# Patient Record
Sex: Male | Born: 1945 | Race: White | Hispanic: No | Marital: Married | Smoking: Former smoker
Health system: Southern US, Community
[De-identification: ages and names within clinical notes are randomized; demographics above are authoritative.]

## PROBLEM LIST (undated history)

## (undated) DIAGNOSIS — E7800 Pure hypercholesterolemia, unspecified: Secondary | ICD-10-CM

## (undated) DIAGNOSIS — E039 Hypothyroidism, unspecified: Secondary | ICD-10-CM

## (undated) DIAGNOSIS — E119 Type 2 diabetes mellitus without complications: Secondary | ICD-10-CM

## (undated) DIAGNOSIS — I10 Essential (primary) hypertension: Secondary | ICD-10-CM

---

## 2022-03-25 ENCOUNTER — Emergency Department
Admission: EM | Admit: 2022-03-25 | Discharge: 2022-03-26 | Disposition: A | Discharge status: Other Acute Inpatient Hospital | Payer: Medicare Other | Attending: Emergency Medicine | Admitting: Emergency Medicine

## 2022-03-25 DIAGNOSIS — R456 Violent behavior: Secondary | ICD-10-CM | POA: Diagnosis present

## 2022-03-25 DIAGNOSIS — R7401 Elevation of levels of liver transaminase levels: Secondary | ICD-10-CM | POA: Diagnosis not present

## 2022-03-25 DIAGNOSIS — R4587 Impulsiveness: Secondary | ICD-10-CM | POA: Insufficient documentation

## 2022-03-25 DIAGNOSIS — R4689 Other symptoms and signs involving appearance and behavior: Secondary | ICD-10-CM | POA: Diagnosis present

## 2022-03-25 DIAGNOSIS — R7309 Other abnormal glucose: Secondary | ICD-10-CM | POA: Diagnosis not present

## 2022-03-25 DIAGNOSIS — F319 Bipolar disorder, unspecified: Secondary | ICD-10-CM | POA: Diagnosis not present

## 2022-03-25 DIAGNOSIS — Z20822 Contact with and (suspected) exposure to covid-19: Secondary | ICD-10-CM | POA: Diagnosis not present

## 2022-03-25 DIAGNOSIS — F29 Unspecified psychosis not due to a substance or known physiological condition: Secondary | ICD-10-CM | POA: Diagnosis not present

## 2022-03-25 DIAGNOSIS — D72829 Elevated white blood cell count, unspecified: Secondary | ICD-10-CM | POA: Diagnosis not present

## 2022-03-25 LAB — CBC
HCT: 45.3 % (ref 39.0–52.0)
Hemoglobin: 15.1 g/dL (ref 13.0–17.0)
MCH: 29.2 pg (ref 26.0–34.0)
MCHC: 33.3 g/dL (ref 30.0–36.0)
MCV: 87.6 fL (ref 80.0–100.0)
Platelets: 339 10*3/uL (ref 150–400)
RBC: 5.17 MIL/uL (ref 4.22–5.81)
RDW: 14.7 % (ref 11.5–15.5)
WBC: 12.6 10*3/uL — ABNORMAL HIGH (ref 4.0–10.5)
nRBC: 0 % (ref 0.0–0.2)

## 2022-03-25 LAB — COMPREHENSIVE METABOLIC PANEL
ALT: 39 U/L (ref 0–44)
AST: 44 U/L — ABNORMAL HIGH (ref 15–41)
Albumin: 4.2 g/dL (ref 3.5–5.0)
Alkaline Phosphatase: 80 U/L (ref 38–126)
Anion gap: 12 (ref 5–15)
BUN: 20 mg/dL (ref 8–23)
CO2: 21 mmol/L — ABNORMAL LOW (ref 22–32)
Calcium: 9.5 mg/dL (ref 8.9–10.3)
Chloride: 104 mmol/L (ref 98–111)
Creatinine, Ser: 1.14 mg/dL (ref 0.61–1.24)
GFR, Estimated: >60 mL/min (ref >60)
Glucose, Bld: 141 mg/dL — ABNORMAL HIGH (ref 70–99)
Potassium: 3.9 mmol/L (ref 3.5–5.1)
Sodium: 137 mmol/L (ref 135–145)
Total Bilirubin: 0.7 mg/dL (ref 0.3–1.2)
Total Protein: 8 g/dL (ref 6.5–8.1)

## 2022-03-25 LAB — SALICYLATE LEVEL: Salicylate Lvl: <7 mg/dL — ABNORMAL LOW (ref 7.0–30.0)

## 2022-03-25 LAB — RESP PANEL BY RT-PCR (FLU A&B, COVID) ARPGX2
Influenza A by PCR: NEGATIVE
Influenza B by PCR: NEGATIVE
SARS Coronavirus 2 by RT PCR: NEGATIVE

## 2022-03-25 LAB — ETHANOL: Alcohol, Ethyl (B): <10 mg/dL (ref <10)

## 2022-03-25 LAB — ACETAMINOPHEN LEVEL: Acetaminophen (Tylenol), Serum: <10 ug/mL — ABNORMAL LOW (ref 10–30)

## 2022-03-25 MED ORDER — ADULT MULTIVITAMIN W/MINERALS CH
1.0000 | ORAL_TABLET | Freq: Every day | ORAL | Status: DC
  Administered 2022-03-26: 1 via ORAL
  Filled 2022-03-25: qty 1

## 2022-03-25 MED ORDER — LORAZEPAM 1 MG PO TABS
1.0000 mg | ORAL_TABLET | Freq: Once | ORAL | Status: AC
  Administered 2022-03-26: 1 mg via ORAL
  Filled 2022-03-25 (×2): qty 1

## 2022-03-25 MED ORDER — LORAZEPAM 1 MG PO TABS: 1.0000 mg | ORAL_TABLET | ORAL | Status: DC | PRN

## 2022-03-25 MED ORDER — FOLIC ACID 1 MG PO TABS
1.0000 mg | ORAL_TABLET | Freq: Every day | ORAL | Status: DC
  Administered 2022-03-26: 1 mg via ORAL
  Filled 2022-03-25: qty 1

## 2022-03-25 MED ORDER — THIAMINE HCL 100 MG/ML IJ SOLN
100.0000 mg | Freq: Every day | INTRAMUSCULAR | Status: DC
  Filled 2022-03-25: qty 1

## 2022-03-25 MED ORDER — THIAMINE HCL 100 MG PO TABS
100.0000 mg | ORAL_TABLET | Freq: Every day | ORAL | Status: DC
  Administered 2022-03-26: 100 mg via ORAL
  Filled 2022-03-25: qty 1

## 2022-03-25 MED ORDER — ZIPRASIDONE MESYLATE 20 MG IM SOLR
20.0000 mg | Freq: Once | INTRAMUSCULAR | Status: AC
  Administered 2022-03-25: 20 mg via INTRAMUSCULAR
  Filled 2022-03-25: qty 20

## 2022-03-25 MED ORDER — LORAZEPAM 2 MG/ML IJ SOLN: 1.0000 mg | INTRAMUSCULAR | Status: DC | PRN

## 2022-03-25 NOTE — ED Triage Notes (Signed)
Pt presents via IVC with Sheriff. Pt has hx bipolar. IVC paperwork reports pt is aggressive, carrying a gun at all times outside of the hospital. Reports refusing to take medication.  ?

## 2022-03-25 NOTE — ED Notes (Signed)
Md aware that pt refused po ativan.  Per dr Tamala Julian, no other meds at this time.   ?

## 2022-03-25 NOTE — ED Notes (Signed)
Pt sleeping. 

## 2022-03-25 NOTE — ED Notes (Signed)
Pt brought back to room 20 with officer and rn.  Pt talking nonstop.  Pt refusing meds and cursing.  Pt reportedly not taking his meds at home, hx bipolar.  pt ?

## 2022-03-25 NOTE — BH Assessment (Signed)
Comprehensive Clinical Assessment (CCA) Note ? ?03/25/2022 ?Jorge Barrett ?[id number] ?Recommendations for Services/Supports/Treatments:  Consulted with Jorge Barrett., NP, who recommended pt. be observed overnight and reassessed in the AM. Notified Dr. Katrinka Barrett and Amy, RN of disposition recommendation.  ? ?Jorge Barrett is a 76 year old, English speaking, Caucasian male with a PMH of bipolar disorder. Per triage note:  Pt presents via IVC with Jorge Barrett. Pt has hx bipolar. IVC paperwork reports pt is aggressive, carrying a gun at all times outside of the hospital. Reports refusing to take medication. Of note pt was agitated and highly erratic while being escorted to his room. Pt was eating, in bed upon this writer's arrival. Pt presented with loud, pressured, and tangential speech. Pt was preoccupied with anger/resentment towards his wife for having him IVC'd, repeatedly calling her a dumb ass. Throughout the assessment pt. was impulsive and highly distractible. Pt.'s thoughts were grandiose and loose. Pt was scattered and had no insight into his behavior. Pt identified his main stressors as marital discord and family conflict with his daughter. Pt was manic with poor reality testing. Pt's mood was angry and his affect was tense. Pt was unable to answer assessment questions appropriately.  ?Chief Complaint:  ?Chief Complaint  ?Patient presents with  ? Psychiatric Evaluation  ? ?Visit Diagnosis: Bipolar disorder  ? ? ?CCA Screening, Triage and Referral (STR) ? ?Patient Reported Information ?How did you hear about [place]? Family/Friend ? ?Referral name: No data recorded ?Referral phone number: No data recorded ? ?Whom do you see for routine medical problems? No data recorded ?Practice/Facility Name: No data recorded ?Practice/Facility Phone Number: No data recorded ?Name of Contact: No data recorded ?Contact Number: No data recorded ?Contact Fax Number: No data recorded ?Prescriber Name: No data recorded ?Prescriber  Address (if known): No data recorded ? ?What Is the Reason for Your Visit/Call Today? Pt presents via IVC with Jorge Barrett. Pt has hx bipolar. IVC paperwork reports pt is aggressive, carrying a gun at all times outside of the hospital. Reports refusing to take medication. ? ?How Long Has This Been Causing You Problems? <Week ? ?What Do You Feel Would Help You the Most Today? Treatment for Depression or other mood problem ? ? ?Have You Recently Been in Any Inpatient Treatment (Hospital/Detox/Crisis Center/28-Day Program)? No data recorded ?Name/Location of Program/Hospital:No data recorded ?How Long Were You There? No data recorded ?When Were You Discharged? No data recorded ? ?Have You Ever Received Services From [employer] Before? No data recorded ?Who Do You See at [facility]? No data recorded ? ?Have You Recently Had Any Thoughts About Hurting Yourself? No ? ?Are You Planning to Commit Suicide/Harm Yourself At This time? No ? ? ?Have you Recently Had Thoughts About Hurting Someone Jorge Barrett? No ? ?Explanation: No data recorded ? ?Have You Used Any Alcohol or Drugs in the Past 24 Hours? No ? ?How Long Ago Did You Use Drugs or Alcohol? No data recorded ?What Did You Use and How Much? No data recorded ? ?Do You Currently Have a Therapist/Psychiatrist? No ? ?Name of Therapist/Psychiatrist: No data recorded ? ?Have You Been Recently Discharged From Any Office Practice or Programs? No ? ?Explanation of Discharge From Practice/Program: No data recorded ? ?  ?CCA Screening Triage Referral Assessment ?Type of Contact: Face-to-Face ? ?Is this Initial or Reassessment? No data recorded ?Date Telepsych consult ordered in [facility]:  No data recorded ?Time Telepsych consult ordered in [facility]:  No data recorded ? ?Patient Reported Information Reviewed? No data recorded ?  Patient Left Without Being Seen? No data recorded ?Reason for Not Completing Assessment: No data recorded ? ?Collateral Involvement: None provided ? ? ?Does Patient Have a  Automotive engineer Guardian? No data recorded ?Name and Contact of Legal Guardian: No data recorded ?If Minor and Not Living with Parent(s), Who has Custody? n/a ? ?Is CPS involved or ever been involved? Never ? ?Is APS involved or ever been involved? Never ? ? ?Patient Determined To Be At Risk for Harm To Self or Others Based on Review of Patient Reported Information or Presenting Complaint? Yes, for Harm to Others ? ?Method: No Plan ? ?Availability of Means: Has close by ? ?Intent: Intends to cause physical harm but not necessarily death ? ?Notification Required: No need or identified person ? ?Additional Information for Danger to Others Potential: Active psychosis ? ?Additional Comments for Danger to Others Potential: n/a ? ?Are There Guns or Other Weapons in Your Home? Yes ? ?Types of Guns/Weapons: Pt carries a gun with him at all times. ? ?Are These Weapons Safely Secured?                            -- (Unable to reach family at this time) ? ?Who Could Verify You Are Able To Have These Secured: n/a ? ?Do You Have any Outstanding Charges, Pending Court Dates, Parole/Probation? UTA ? ?Contacted To Inform of Risk of Harm To Self or Others: Unable to Contact: ? ? ?Location of Assessment: [facility] ED ? ? ?Does Patient Present under Involuntary Commitment? Yes ? ?IVC Papers Initial File Date: 03/25/22 ? ? ?[place] of Residence: [place] ? ? ?Patient Currently Receiving the Following Services: Medication Management ? ? ?Determination of Need: Emergent (2 hours) ? ? ?Options For Referral: Therapeutic Triage Services ? ? ? ? ?CCA Biopsychosocial ?Intake/Chief Complaint:  No data recorded ?Current Symptoms/Problems: No data recorded ? ?Patient Reported Schizophrenia/Schizoaffective Diagnosis in Past: No ? ? ?Strengths: Pt is able to assert needs ? ?Preferences: No data recorded ?Abilities: No data recorded ? ?Type of Services Patient Feels are Needed: No data recorded ? ?Initial Clinical Notes/Concerns: No data  recorded ? ?Mental Health Symptoms ?Depression:   ?None ?  ?Duration of Depressive symptoms: No data recorded  ?Mania:   ?Change in energy/activity; Increased Energy; Irritability; Racing thoughts; Overconfidence ?  ?Anxiety:    ?Tension; Worrying; Irritability; Restlessness; Difficulty concentrating ?  ?Psychosis:   ?Grossly disorganized speech ?  ?Duration of Psychotic symptoms:  ?Greater than six months ?  ?Trauma:   ?N/A ?  ?Obsessions:   ?Recurrent & persistent thoughts/impulses/images; Cause anxiety; Absent ?  ?Compulsions:   ?Repeated behaviors/mental acts; Intended to reduce stress or prevent another outcome; "Driven" to perform behaviors/acts; Absent insight/delusional ?  ?Inattention:   ?N/A ?  ?Hyperactivity/Impulsivity:   ?N/A ?  ?Oppositional/Defiant Behaviors:   ?Angry; Argumentative; Defies rules; Easily annoyed; Resentful; Temper ?  ?Emotional Irregularity:   ?Mood lability; Intense/inappropriate anger ?  ?Other Mood/Personality Symptoms:  No data recorded  ? ?Mental Status Exam ?Appearance and self-care  ?Stature:   ?Average ?  ?Weight:   ?Average weight ?  ?Clothing:   ?-- (In scrubs) ?  ?Grooming:   ?Normal ?  ?Cosmetic use:   ?None ?  ?Posture/gait:   ?Normal ?  ?Motor activity:   ?Restless ?  ?Sensorium  ?Attention:   ?Distractible ?  ?Concentration:   ?Focuses on irrelevancies; Scattered ?  ?Orientation:   ?Object; Person; Place; Situation ?  ?  Recall/memory:   ?Normal ?  ?Affect and Mood  ?Affect:   ?Labile ?  ?Mood:   ?Angry; Hypomania ?  ?Relating  ?Eye contact:   ?Normal ?  ?Facial expression:   ?Angry; Tense ?  ?Attitude toward examiner:   ?Irritable ?  ?Thought and Language  ?Speech flow:  ?Loud; Pressured ?  ?Thought content:   ?Delusions ?  ?Preoccupation:   ?Obsessions ?  ?Hallucinations:   ?None ?  ?Organization:  No data recorded  ?Executive Functions  ?Fund of Knowledge:   ?Fair ?  ?Intelligence:   ?Average ?  ?Abstraction:   ?Normal ?  ?Judgement:   ?Poor ?  ?Reality Testing:    ?Distorted ?  ?Insight:   ?None/zero insight ?  ?Decision Making:   ?Impulsive ?  ?Social Functioning  ?Social Maturity:   ?Impulsive ?  ?Social Judgement:   ?Heedless ?  ?Stress  ?Stressors:   ?Relationship; Family conf
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
## 2022-03-25 NOTE — ED Notes (Signed)
Meds given with officers in room.  Pt talking nonstop and uncooperative.    ?
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
## 2022-03-26 ENCOUNTER — Other Ambulatory Visit: Payer: Self-pay

## 2022-03-26 ENCOUNTER — Inpatient Hospital Stay
Admission: AD | Admit: 2022-03-26 | Discharge: 2022-04-04 | DRG: 885 | Disposition: A | Discharge status: Home / Self Care | Payer: Medicare Other | Source: Intra-hospital | Attending: Psychiatry | Admitting: Psychiatry

## 2022-03-26 ENCOUNTER — Encounter: Payer: Self-pay | Admitting: Psychiatry

## 2022-03-26 DIAGNOSIS — E7800 Pure hypercholesterolemia, unspecified: Secondary | ICD-10-CM | POA: Diagnosis present

## 2022-03-26 DIAGNOSIS — R456 Violent behavior: Secondary | ICD-10-CM | POA: Diagnosis not present

## 2022-03-26 DIAGNOSIS — Z79890 Hormone replacement therapy (postmenopausal): Secondary | ICD-10-CM

## 2022-03-26 DIAGNOSIS — Z87891 Personal history of nicotine dependence: Secondary | ICD-10-CM | POA: Diagnosis not present

## 2022-03-26 DIAGNOSIS — E119 Type 2 diabetes mellitus without complications: Secondary | ICD-10-CM | POA: Diagnosis present

## 2022-03-26 DIAGNOSIS — Z7984 Long term (current) use of oral hypoglycemic drugs: Secondary | ICD-10-CM | POA: Diagnosis not present

## 2022-03-26 DIAGNOSIS — F319 Bipolar disorder, unspecified: Principal | ICD-10-CM | POA: Diagnosis present

## 2022-03-26 DIAGNOSIS — F22 Delusional disorders: Secondary | ICD-10-CM | POA: Diagnosis present

## 2022-03-26 DIAGNOSIS — E039 Hypothyroidism, unspecified: Secondary | ICD-10-CM | POA: Diagnosis present

## 2022-03-26 DIAGNOSIS — Z79899 Other long term (current) drug therapy: Secondary | ICD-10-CM

## 2022-03-26 DIAGNOSIS — Z7985 Long-term (current) use of injectable non-insulin antidiabetic drugs: Secondary | ICD-10-CM

## 2022-03-26 DIAGNOSIS — K219 Gastro-esophageal reflux disease without esophagitis: Secondary | ICD-10-CM | POA: Diagnosis present

## 2022-03-26 DIAGNOSIS — Z888 Allergy status to other drugs, medicaments and biological substances status: Secondary | ICD-10-CM

## 2022-03-26 DIAGNOSIS — R4689 Other symptoms and signs involving appearance and behavior: Secondary | ICD-10-CM | POA: Diagnosis present

## 2022-03-26 DIAGNOSIS — I10 Essential (primary) hypertension: Secondary | ICD-10-CM | POA: Diagnosis present

## 2022-03-26 DIAGNOSIS — F29 Unspecified psychosis not due to a substance or known physiological condition: Secondary | ICD-10-CM | POA: Diagnosis present

## 2022-03-26 DIAGNOSIS — R4587 Impulsiveness: Secondary | ICD-10-CM | POA: Diagnosis present

## 2022-03-26 HISTORY — DX: Pure hypercholesterolemia, unspecified: E78.00

## 2022-03-26 HISTORY — DX: Hypothyroidism, unspecified: E03.9

## 2022-03-26 HISTORY — DX: Type 2 diabetes mellitus without complications: E11.9

## 2022-03-26 HISTORY — DX: Essential (primary) hypertension: I10

## 2022-03-26 LAB — URINE DRUG SCREEN, QUALITATIVE (ARMC ONLY)
Amphetamines, Ur Screen: NOT DETECTED
Barbiturates, Ur Screen: NOT DETECTED
Benzodiazepine, Ur Scrn: NOT DETECTED
Cannabinoid 50 Ng, Ur ~~LOC~~: NOT DETECTED
Cocaine Metabolite,Ur ~~LOC~~: NOT DETECTED
MDMA (Ecstasy)Ur Screen: NOT DETECTED
Methadone Scn, Ur: NOT DETECTED
Opiate, Ur Screen: NOT DETECTED
Phencyclidine (PCP) Ur S: NOT DETECTED
Tricyclic, Ur Screen: NOT DETECTED

## 2022-03-26 LAB — CBG MONITORING, ED: Glucose-Capillary: 125 mg/dL — ABNORMAL HIGH (ref 70–99)

## 2022-03-26 LAB — GLUCOSE, CAPILLARY: Glucose-Capillary: 103 mg/dL — ABNORMAL HIGH (ref 70–99)

## 2022-03-26 MED ORDER — THIAMINE HCL 100 MG/ML IJ SOLN: 100.0000 mg | Freq: Every day | INTRAMUSCULAR | Status: DC

## 2022-03-26 MED ORDER — MAGNESIUM HYDROXIDE 400 MG/5ML PO SUSP: 30.0000 mL | Freq: Every day | ORAL | Status: DC | PRN

## 2022-03-26 MED ORDER — OLANZAPINE 5 MG PO TABS: 5.0000 mg | ORAL_TABLET | Freq: Every day | ORAL | Status: DC

## 2022-03-26 MED ORDER — ACETAMINOPHEN 325 MG PO TABS
650.0000 mg | ORAL_TABLET | Freq: Four times a day (QID) | ORAL | Status: DC | PRN
Start: 2022-03-26 — End: 2022-03-29
  Administered 2022-03-27 (×2): 650 mg via ORAL
  Filled 2022-03-26 (×2): qty 2

## 2022-03-26 MED ORDER — LORAZEPAM 2 MG/ML IJ SOLN: 1.0000 mg | INTRAMUSCULAR | Status: DC | PRN

## 2022-03-26 MED ORDER — LORAZEPAM 1 MG PO TABS
1.0000 mg | ORAL_TABLET | Freq: Four times a day (QID) | ORAL | Status: DC | PRN
  Administered 2022-03-26: 1 mg via ORAL
  Filled 2022-03-26: qty 1

## 2022-03-26 MED ORDER — OLANZAPINE 5 MG PO TBDP
5.0000 mg | ORAL_TABLET | Freq: Once | ORAL | Status: AC
  Administered 2022-03-26: 5 mg via ORAL
  Filled 2022-03-26: qty 1

## 2022-03-26 MED ORDER — FOLIC ACID 1 MG PO TABS
1.0000 mg | ORAL_TABLET | Freq: Every day | ORAL | Status: DC
  Administered 2022-03-27 – 2022-04-04 (×9): 1 mg via ORAL
  Filled 2022-03-26 (×9): qty 1

## 2022-03-26 MED ORDER — ADULT MULTIVITAMIN W/MINERALS CH
1.0000 | ORAL_TABLET | Freq: Every day | ORAL | Status: DC
  Administered 2022-03-27 – 2022-04-04 (×9): 1 via ORAL
  Filled 2022-03-26 (×9): qty 1

## 2022-03-26 MED ORDER — LORAZEPAM 2 MG PO TABS
2.0000 mg | ORAL_TABLET | Freq: Once | ORAL | Status: AC
  Administered 2022-03-26: 2 mg via ORAL
  Filled 2022-03-26: qty 1

## 2022-03-26 MED ORDER — LORAZEPAM 1 MG PO TABS: 1.0000 mg | ORAL_TABLET | Freq: Four times a day (QID) | ORAL | Status: DC | PRN

## 2022-03-26 MED ORDER — LORAZEPAM 1 MG PO TABS: 1.0000 mg | ORAL_TABLET | ORAL | Status: DC | PRN

## 2022-03-26 MED ORDER — OLANZAPINE 5 MG PO TABS
5.0000 mg | ORAL_TABLET | Freq: Every day | ORAL | Status: DC
  Administered 2022-03-26: 5 mg via ORAL
  Filled 2022-03-26: qty 1

## 2022-03-26 MED ORDER — ALUM & MAG HYDROXIDE-SIMETH 200-200-20 MG/5ML PO SUSP: 30.0000 mL | ORAL | Status: DC | PRN

## 2022-03-26 MED ORDER — THIAMINE HCL 100 MG PO TABS
100.0000 mg | ORAL_TABLET | Freq: Every day | ORAL | Status: DC
  Administered 2022-03-27 – 2022-04-04 (×9): 100 mg via ORAL
  Filled 2022-03-26 (×9): qty 1

## 2022-03-26 NOTE — Progress Notes (Signed)
Patient woke up and is still very tired. Patient was incontinent of urine. Patient was changed and brought to the bathroom. Patient is able to walk from room to dayroom with front wheel walker with contact guard assist. Patient appears very unsteady. Patient yelled out "water" multiple times. Patient was given water and dinner tray. Patient is currently in the dayroom eating dinner. ?

## 2022-03-26 NOTE — ED Notes (Addendum)
Offered ativan due to CIWA and agitation, declines. Pt provided water. Pt with rapid and loud speech, disorganized thoughts, grandiose delusions. ?

## 2022-03-26 NOTE — ED Notes (Signed)
Spoke to patient's wife and daughter with verbal permission from pt.  Daughter stated that she and her brother noticed a difference with pt's behavior "At the end of January/beginning of February he started calling more and talking faster."  Family reported that pt's behavior has worsened up until this point.  Daughter stated that he was inpatient once prior in 2018 for this same type of behavior(s).  ?

## 2022-03-26 NOTE — ED Provider Notes (Signed)
Emergency Medicine Observation Re-evaluation Note ? ?Jorge Barrett is a 76 y.o. male, seen on rounds today.  Pt initially presented to the ED for complaints of Psychiatric Evaluation ?Currently, the patient is resting, voices no medical complaints. ? ?Physical Exam  ?BP (!) 157/91   Pulse (!) 101   Temp 98.6 ?F (37 ?C) (Oral)   Resp 16   SpO2 98%  ?Physical Exam ?General: Resting in no acute distress ?Cardiac: No cyanosis ?Lungs: Equal rise and fall ?Psych: Not agitated ? ?ED Course / MDM  ?EKG:  ? ?I have reviewed the labs performed to date as well as medications administered while in observation.  Recent changes in the last 24 hours include no events overnight. ? ?Plan  ?Current plan is for psychiatric disposition. ?Jorge Barrett is under involuntary commitment. ?  ? ?  ?Irean Hong, MD ?03/26/22 0530 ? ?

## 2022-03-26 NOTE — Plan of Care (Signed)

## 2022-03-26 NOTE — ED Notes (Signed)
Hospital meal provided, pt tolerated w/o complaints.  Waste discarded appropriately.  

## 2022-03-26 NOTE — ED Notes (Signed)
Pt woken up from nap and offered hospital lunch meal.  Pt declined and requested to return to sleep.  Staff informed pt that he would be transferring to an inpatient unit today, pt is agreeable. ?

## 2022-03-26 NOTE — ED Notes (Signed)
Report to [place], RN in [place]. Pt taken to unit by RN. ?

## 2022-03-26 NOTE — Progress Notes (Addendum)
?  Chaplain On-Call responded to Spiritual Care Consult Order from Dr. Chiquita Loth, which read "psych patient requesting Chaplain for emotional support". ? ?Chaplain knocked on the Unit door, and the Nurse who answered stated that the patient is sleeping soundly after being unable to do so for a lengthy time. ? ?Chaplain will refer to the next On-Call Chaplain for follow-up and visit attempt. ? ?Chaplain Evelena Peat ?M.Div., [facility] ?

## 2022-03-26 NOTE — ED Notes (Signed)
Pt is awake, watching tv with no acute distress. ?

## 2022-03-26 NOTE — ED Notes (Signed)
Pt a/o x3 ambulatory and continent, sent to inpatient geropsych @ [facility] report called to Deirdre Priest, RN.  All belongings accounted for and sent with security to [place] unit ?

## 2022-03-26 NOTE — TOC Initial Note (Signed)
Transition of Care (TOC) - Initial/Assessment Note  ? ? ?Patient Details  ?Name: Jorge Barrett [facility] ?MRN: [id number] ?Date of Birth: Oct 09, 1946 ? ?Transition of Care (TOC) CM/SW Contact:    ?Allayne Butcher, RN ?Phone Number: ?03/26/2022, 1:49 PM ? ?Clinical Narrative:                 ?Patient is being admitted to [facility].  TOC signing off.  ? ?  ?  ? ? ?Patient Goals and CMS Choice ?  ?  ?  ? ?Expected Discharge Plan and Services ?  ?  ?  ?  ?  ?                ?  ?  ?  ?  ?  ?  ?  ?  ?  ?  ? ?Prior Living Arrangements/Services ?  ?  ?  ?       ?  ?  ?  ?  ? ?Activities of Daily Living ?  ?  ? ?Permission Sought/Granted ?  ?  ?   ?   ?   ?   ? ?Emotional Assessment ?  ?  ?  ?  ?  ?  ? ?Admission diagnosis:  IVC ?Patient Active Problem List  ? Diagnosis Date Noted  ? Bipolar 1 disorder (HCC) 03/26/2022  ? Aggressive behavior 03/26/2022  ? Psychosis (HCC) 03/26/2022  ? ?PCP:  Pcp, No ?Pharmacy:   ?[facility] - [place], [place] - [address] ?[address] ?[place] [address] ?Phone: [phone] Fax: [phone] ? ? ? ? ?Social Determinants of Health (SDOH) Interventions ?  ? ?Readmission Risk Interventions ?   ? View : No data to display.  ?  ?  ?  ? ? ? ?

## 2022-03-26 NOTE — BH Assessment (Signed)
Patient is to be admitted to [facility] Unit today 03/26/22 at 2:00pm by Dr.  Marlou Porch .  ?Attending Physician will be Dr.  Marlou Porch .   ?Patient has been assigned to room L-34, by [facility] Charge Nurse Marchelle Folks.   ?  ?ER staff is aware of the admission: ?Drinda Butts, ER Secretary   ?Dr. Darnelle Catalan, ER MD  ?Florentina Addison, Patient's Nurse  ?Sue Lush, Patient Access.  ?
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
## 2022-03-26 NOTE — Progress Notes (Signed)
Patient came onto unit asleep and snoring. Patient required multiple sternal rub to arouse patient. Unit manager notified and came to unit to assess patient. Patient vitals and CBG were taken. BP was 144/78, and CBG was 103. Other vital signs WNL.  Patient was able to tell staff his name and birthday before falling back asleep. Patient sedated and unable to answer admission questions. Patient's pants were wet and patient was changed with assistance of staff members. Skin assessment done with Alphonzo Lemmings, RN. No contraband found. Patient's head and face was flushed. Both legs were swollen and discolored. Patient was able to stand and transfer to bed with assistance of staff members.  ?

## 2022-03-26 NOTE — ED Notes (Signed)
Pt. Transferred to [place] , room# 5 from main ED .Patient was screened by security before entering the unit. Report to include Situation, Background, Assessment and Recommendations from Ara Kussmaul, RN . Pt. Oriented to unit including Q15 minute rounds as well as the security cameras for their protection. Patient is alert and oriented, warm and dry in no acute distress. Unit rules and regulations have been explained to patient.  Pt is hyper-verbal and seems to be fixated on spousal infidelity.  Staff explained to pt that the [facility] would be in later to reassess for inpatient needs ?

## 2022-03-26 NOTE — ED Notes (Signed)
BS 125
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
## 2022-03-26 NOTE — ED Notes (Addendum)
Pt standing at entrance of his room door trying to talk to the patients in the hallway. Explained that this is not appropriate and that he must stay in his room at this time. Pt requesting door to stay open. Informed pt that if he would like the door to stay open, he must stay in his room and stop talking to the adolescent patients in the hallway. Pt verbalizes understanding. ?

## 2022-03-27 ENCOUNTER — Encounter: Payer: Self-pay | Admitting: Psychiatry

## 2022-03-27 ENCOUNTER — Inpatient Hospital Stay: Payer: Medicare Other

## 2022-03-27 DIAGNOSIS — F319 Bipolar disorder, unspecified: Secondary | ICD-10-CM | POA: Diagnosis not present

## 2022-03-27 MED ORDER — QUETIAPINE FUMARATE 100 MG PO TABS
100.0000 mg | ORAL_TABLET | Freq: Every day | ORAL | Status: DC
  Administered 2022-03-27 – 2022-04-03 (×8): 100 mg via ORAL
  Filled 2022-03-27 (×8): qty 1

## 2022-03-27 MED ORDER — TEMAZEPAM 15 MG PO CAPS
30.0000 mg | ORAL_CAPSULE | Freq: Every evening | ORAL | Status: DC | PRN
  Administered 2022-03-28 – 2022-04-01 (×5): 30 mg via ORAL
  Filled 2022-03-27 (×5): qty 2

## 2022-03-27 MED ORDER — GLUCERNA SHAKE PO LIQD
237.0000 mL | Freq: Three times a day (TID) | ORAL | Status: DC
Start: 2022-03-27 — End: 2022-04-04
  Administered 2022-03-27 – 2022-04-04 (×26): 237 mL via ORAL

## 2022-03-27 NOTE — BHH Counselor (Signed)
CSW attempted to meet with pt regarding completion of the PSA. However, due to pt's current state assessment will need to be completed at a later date. Pt currently agitated (hyperverbal and argumentative), fussing at nursing staff, with focus directed towards discharge and wanting to speak to doctor about leaving. CSW will attempt assessment at a later date. ? ?Jorge Barrett. Jorge Barrett, MSW, LCSW, LCAS ?03/27/2022 3:48 PM ? ?

## 2022-03-27 NOTE — Progress Notes (Signed)
At approximately 0126, MHT heard pt's walker fall and pt say "Oh shit". Pt was found near room door by (2-RNs and MHT) lying on his R side on floor covered in urine with large amount of urine on floor with his walker overturned nearby. Pt states that he fell while ambulating with walker to bathroom. He c/o R hip pain. He denies headache. No obvious injuries noted upon inspection of head and body. Pt assisted to wheelchair with assistance of 4 staff members, cleaned and placed back into bed with assistance of 3 staff members due to his overall weakness. On-call provider notified and order for R hip x-ray obtained. ?

## 2022-03-27 NOTE — Progress Notes (Signed)
NUTRITION ASSESSMENT ? ?Pt identified as at risk on the Malnutrition Screen Tool ? ?INTERVENTION: ? ?-Continue MVI with minerals daily ?-Change diet to carb modified ?-Double protein portions with meals ?-Glucerna Shake po TID, each supplement provides 220 kcal and 10 grams of protein  ? ?NUTRITION DIAGNOSIS: ?Unintentional weight loss related to sub-optimal intake as evidenced by pt report.  ? ?Goal: ?Pt to meet >/= 90% of their estimated nutrition needs. ? ?Monitor:  ?PO intake ? ?Assessment:  ?Pt with PMH of HTN, DM, hypercholesterolemia, and hypothyroidism. ? ?Pt admitted with bipolar disorder and is he currently IVC'd.  ? ?Noted pt fell this morning per nursing notes.  ? ?Per RN notes, pt reports good appetite and has been eating most meals. He did refuse one meal tray.  ? ?Per CareEverywhere records, wt was 262# on 01/17/22. Pt has experienced a 10.2% wt loss over the past 2 months, which is significant for time frame.  ? ?Medications reviewed and include folic acid and thiamine.  ? ?Per CarEverywhere, last documented Hgb A1c: 7.1. PTA DM medications are 2 mg semaglutide weekly.   ? ?Labs reviewed: CBGS: 103-125 (inpatient orders for glycemic control are none).   ? ?Height: ?Ht Readings from Last 1 Encounters:  ?03/26/22 6' (1.829 m)  ? ? ?Weight: ?Wt Readings from Last 1 Encounters:  ?03/26/22 107 kg  ? ? ?Weight Hx: ?Wt Readings from Last 10 Encounters:  ?03/26/22 107 kg  ? ? 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 ? ?Estimated Nutritional Needs: ?Kcal: 25-30 kcal/kg ?Protein: > 1 gram protein/kg ?Fluid: 1 ml/kcal ? ?Diet Order:  ?Diet Order   ? ?       ?  Diet regular Room service appropriate? Yes; Fluid consistency: Thin  Diet effective now       ?  ? ?  ?  ? ?  ? ?Pt is also offered choice of unit snacks mid-morning and mid-afternoon.  ?Pt is eating as desired.  ? ?Lab results and medications reviewed.  ? ?Levada Schilling, RD, LDN, CDCES ?Registered  Dietitian II ?Certified Diabetes Care and Education Specialist ?Please refer to [facility] for RD and/or RD on-call/weekend/after hours pager   ?

## 2022-03-27 NOTE — Progress Notes (Signed)
Pt sitting on the bed naked covered by a towel. Pt walker is in the bathroom. "I'm walking alright without it." Pt educated on need to use his walker for ambulation given his fall last night. Pt verbalized understanding. Non-slip socks placed on patient's feet. Pt has a room close to the nurse's station.  ?

## 2022-03-27 NOTE — [facility] Suicide Risk Assessment (Signed)
[facility] Admission Suicide Risk Assessment ? ? ?Nursing information obtained from:    ?Demographic factors:    ?Current Mental Status:    ?Loss Factors:    ?Historical Factors:    ?Risk Reduction Factors:    ? ?Total Time spent with patient: 1 hour ?Principal Problem: Bipolar 1 disorder ([place]) ?Diagnosis:  Principal Problem: ?  Bipolar 1 disorder ([place]) ? ?Subjective Data: Jorge Barrett is a 76 y.o. male patient presented to [facility] ED via law enforcement under involuntary commitment status (IVC). Per the ED triage nurse note, Pt presents via IVC with [place]. Pt has hx bipolar. IVC paperwork reports pt is aggressive, carrying a gun at all times outside the hospital. It was reported that he was refusing to take medication.  ?The patient has a hx of bipolar disorder. The patient was aggressive; he was always carrying a gun outside of the hospital. Reports were refusing to take medication. Of note, pt was agitated and highly erratic during his initial assessment. The patient refuses to take any medication when the nurse offers him. The patient is threatening his wife and using disparaging language toward his wife. The patient is preoccupied with anger/resentment towards his wife for having him IVC'd, repeatedly calling her a dumb ass. Throughout the assessment, pt. was impulsive and highly distractible.  ? ?This provider saw The patient face-to-face; the chart was reviewed, and consulted with Dr. Tamala Julian on 03/25/2022 due to the patient's care. It was discussed with the EDP that the patient remained under observation overnight and will be reassessed in the a.m. to determine if he meets the criteria for psychiatric inpatient admission; he could be discharged home. ? ?On evaluation, the patient is alert and oriented x4, angry, uncooperative, and mood-congruent with affect. The patient does not appear to be responding to internal or external stimuli. The patient is presenting with delusional thinking. The patient denies  auditory or visual hallucinations. The patient is presenting with psychotic and paranoid behaviors. During an encounter with the patient, he cannot answer most questions. ? ?Continued Clinical Symptoms:  ?Alcohol Use Disorder Identification Test Final Score (AUDIT): 3 ?The "Alcohol Use Disorders Identification Test", Guidelines for Use in Primary Care, Second Edition.  World Pharmacologist [facility]). ?Score between 0-7:  no or low risk or alcohol related problems. ?Score between 8-15:  moderate risk of alcohol related problems. ?Score between 16-19:  high risk of alcohol related problems. ?Score 20 or above:  warrants further diagnostic evaluation for alcohol dependence and treatment. ? ? ?CLINICAL FACTORS: Manic ?  ? ? ?Musculoskeletal: ?Strength & Muscle Tone: within normal limits ?Gait & Station: normal ?Patient leans: N/A ? ?Psychiatric Specialty Exam: ? ?Presentation  ?General Appearance: Bizarre ? ?Eye Contact:Good ? ?Speech:Pressured ? ?Speech Volume:Increased ? ?Handedness:Right ? ? ?Mood and Affect  ?Mood:Irritable ? ?Affect:Inappropriate; Full Range ? ? ?Thought Process  ?Thought Processes:Disorganized ? ?Descriptions of Associations:Tangential ? ?Orientation:Full (Time, Place and Person) ? ?Thought Content:Illogical; Tangential ? ?History of Schizophrenia/Schizoaffective disorder:No ? ?Duration of Psychotic Symptoms:Greater than six months ? ?Hallucinations:No data recorded ?Ideas of Reference:None ? ?Suicidal Thoughts:No data recorded ?Homicidal Thoughts:No data recorded ? ?Sensorium  ?Memory:Immediate Poor; Recent Fair; Remote Fair ? ?Judgment:Impaired ? ?Insight:Poor ? ? ?Executive Functions  ?Concentration:Poor ? ?Attention Span:Poor ? ?Recall:Poor ? ?Fund of Knowledge:Poor ? ?Language:Poor ? ? ?Psychomotor Activity  ?Psychomotor Activity:No data recorded ? ?Assets  ?Assets:Desire for Improvement; Resilience; Social Support ? ? ?Sleep  ?Sleep:No data recorded ? ? ? ?Blood pressure 133/69, pulse 92,  temperature (!) 97.3 ?F (  36.3 ?C), temperature source Oral, resp. rate 16, height 6' (1.829 m), weight 107 kg, SpO2 97 %. Body mass index is 32.01 kg/m?. ? ? ?COGNITIVE FEATURES THAT CONTRIBUTE TO RISK:  ?Closed-mindedness   ? ?SUICIDE RISK:  ? Minimal: No identifiable suicidal ideation.  Patients presenting with no risk factors but with morbid ruminations; may be classified as minimal risk based on the severity of the depressive symptoms ? ?PLAN OF CARE: See orders ? ?I certify that inpatient services furnished can reasonably be expected to improve the patient's condition.  ? ?Parks Ranger, DO ?03/27/2022, 11:39 AM ? ?

## 2022-03-27 NOTE — Evaluation (Signed)
Physical Therapy Evaluation ?Patient Details ?Name: Jorge Barrett [facility] ?MRN: [id number] ?DOB: 18-Oct-1946 ?Today's Date: 03/27/2022 ? ?History of Present Illness ? Pt admitted under IVC with bipolar 1 and complaints of fall this AM while ambulating to bathroom. History includes bipolar disorder.  ?Clinical Impression ? Pt is a pleasant 76 year old male who was admitted for bipolar. Pt performs transfers with mod I and ambulation with supervision and RW. Pt demonstrates deficits with R hip pain from recent fall, imaging negative. Pt demonstrates all bed mobility/transfers/ambulation at baseline level. Educated in falls prevention and recommended to continue use of RW. Pt does not require any further PT needs at this time. Pt will be dc in house and does not require follow up. RN aware. Will dc current orders. ? ?   ? ?Recommendations for follow up therapy are one component of a multi-disciplinary discharge planning process, led by the attending physician.  Recommendations may be updated based on patient status, additional functional criteria and insurance authorization. ? ?Follow Up Recommendations No PT follow up ? ?  ?Assistance Recommended at Discharge PRN  ?Patient can return home with the following ?   ? ?  ?Equipment Recommendations None recommended by PT  ?Recommendations for Other Services ?    ?  ?Functional Status Assessment Patient has not had a recent decline in their functional status  ? ?  ?Precautions / Restrictions Precautions ?Precautions: Fall ?Restrictions ?Weight Bearing Restrictions: No  ? ?  ? ?Mobility ? Bed Mobility ?  ?  ?  ?  ?  ?  ?  ?General bed mobility comments: not performed, received in chair ?  ? ?Transfers ?Overall transfer level: Modified independent ?Equipment used: Rolling walker (2 wheels) ?Transfers: Sit to/from Stand ?Sit to Stand: Modified independent (Device/Increase time) ?  ?  ?  ?  ?  ?General transfer comment: takes increased time, however pt able to perform transfers  safely using RW. Once standing, upright posture noted ?  ? ?Ambulation/Gait ?Ambulation/Gait assistance: Supervision ?Gait Distance (Feet): 150 Feet ?Assistive device: Rolling walker (2 wheels) ?Gait Pattern/deviations: Step-through pattern ?  ?  ?  ?General Gait Details: ambulated in hallway with safe technique. RW used for safety with pt unable to ambulate and carry conversation, stopping to talk when therapist asked question. Pt able to complete turns without LOB. Cues for keeping RW close to body and falls risk awareness. ? ?Stairs ?  ?  ?  ?  ?  ? ?Wheelchair Mobility ?  ? ?Modified Rankin (Stroke Patients Only) ?  ? ?  ? ?Balance Overall balance assessment: Needs assistance, History of Falls ?Sitting-balance support: Feet supported, No upper extremity supported ?Sitting balance-Leahy Scale: Good ?  ?  ?Standing balance support: Bilateral upper extremity supported ?Standing balance-Leahy Scale: Fair ?  ?  ?  ?  ?  ?  ?  ?  ?  ?  ?  ?  ?   ? ? ? ?Pertinent Vitals/Pain Pain Assessment ?Pain Assessment: Faces ?Faces Pain Scale: Hurts a little bit ?Pain Location: R hip ?Pain Descriptors / Indicators: Discomfort ?Pain Intervention(s): Limited activity within patient's tolerance, Repositioned  ? ? ?Home Living Family/patient expects to be discharged to:: Private residence ?Living Arrangements: Spouse/significant other ?Available Help at Discharge: Family ?  ?  ?  ?  ?  ?  ?Home Equipment: Agricultural consultant (2 wheels) ?Additional Comments: pt is very verbose and difficult to obtain history. Pt reports he lives in [place], unable to obtain accurate home set  up  ?  ?Prior Function Prior Level of Function : Independent/Modified Independent;Driving ?  ?  ?  ?  ?  ?  ?Mobility Comments: reports no recent falls. Previously indep. Has been using RW during this admission ?  ?  ? ? ?Hand Dominance  ?   ? ?  ?Extremity/Trunk Assessment  ? Upper Extremity Assessment ?Upper Extremity Assessment: Overall WFL for tasks  assessed ?  ? ?Lower Extremity Assessment ?Lower Extremity Assessment: Overall WFL for tasks assessed ?  ? ?   ?Communication  ? Communication: No difficulties (very verbose)  ?Cognition Arousal/Alertness: Awake/alert ?Behavior During Therapy: Restless ?Overall Cognitive Status: Within Functional Limits for tasks assessed ?  ?  ?  ?  ?  ?  ?  ?  ?  ?  ?  ?  ?  ?  ?  ?  ?General Comments: very talkative and intermittently agitated ?  ?  ? ?  ?General Comments   ? ?  ?Exercises    ? ?Assessment/Plan  ?  ?PT Assessment Patient does not need any further PT services  ?PT Problem List   ? ?   ?  ?PT Treatment Interventions     ? ?PT Goals (Current goals can be found in the Care Plan section)  ?Acute Rehab PT Goals ?Patient Stated Goal: to go home ?PT Goal Formulation: All assessment and education complete, DC therapy ?Time For Goal Achievement: 03/27/22 ?Potential to Achieve Goals: Good ? ?  ?Frequency   ?  ? ? ?Co-evaluation   ?  ?  ?  ?  ? ? ?  ?AM-PAC PT "6 Clicks" Mobility  ?Outcome Measure Help needed turning from your back to your side while in a flat bed without using bedrails?: None ?Help needed moving from lying on your back to sitting on the side of a flat bed without using bedrails?: None ?Help needed moving to and from a bed to a chair (including a wheelchair)?: None ?Help needed standing up from a chair using your arms (e.g., wheelchair or bedside chair)?: None ?Help needed to walk in hospital room?: None ?Help needed climbing 3-5 steps with a railing? : None ?6 Click Score: 24 ? ?  ?End of Session   ?Activity Tolerance: Patient tolerated treatment well ?Patient left: in chair ?Nurse Communication: Mobility status ?PT Visit Diagnosis: Difficulty in walking, not elsewhere classified (R26.2) ?  ? ?Time: 1610-9604 ?PT Time Calculation (min) (ACUTE ONLY): 22 min ? ? ?Charges:   PT Evaluation ?$PT Eval Low Complexity: 1 Low ?PT Treatments ?$Gait Training: 8-22 mins ?  ?   ? ? ?Elizabeth Palau, PT, DPT,  GCS ?[phone] ? ? ?Jorge Barrett ?03/27/2022, 4:31 PM ? ?

## 2022-03-27 NOTE — Plan of Care (Signed)
  Problem: Clinical Measurements: Goal: Will remain free from infection Outcome: Progressing   Problem: Coping: Goal: Level of anxiety will decrease Outcome: Progressing   Problem: Safety: Goal: Ability to remain free from injury will improve Outcome: Progressing   

## 2022-03-27 NOTE — Progress Notes (Addendum)
Patient appeared very drowsy this morning and was assisted to bathroom and to dayroom for breakfast. Patient with unsteady gait while ambulating with walker. Patient ate all meals and compliant with medication. Pressured speech, grandeur delusions, and cooperative but can be demanding at times and appears focused on discharge due to having to "get back to my business." Patient states he is losing money while he is here. Patient more alert with more steady gait this evening. Patient's x-ray completed as well as PT consult. Patient denies SI,HI, and A/V/H with no plan/intent. Does not understand why he is here and continues to blame his wife. Patient assisted with contacting daughter and son. Patient stated having pain 5 out of 10 on R hip from last night's fall. Tylenol po prn given which provided some temporary relief. No falls/injuries and no s/s of current distress.  ?

## 2022-03-27 NOTE — Progress Notes (Addendum)
   03/27/22 1930  Psych Admission Type (Psych Patients Only)  Admission Status Involuntary  Psychosocial Assessment  Patient Complaints Anger;Other (Comment) (pain in his right hip)  Eye Contact Avoids  Facial Expression Animated  Affect Labile  Speech Pressured;Rapid;Tangential  Interaction Demanding;Dominating;Assertive  Motor Activity Slow  Appearance/Hygiene In scrubs  Behavior Characteristics Cooperative;Irritable  Mood Labile  Thought Process  Coherency Circumstantial  Content Blaming others;Preoccupation;Paranoia  Delusions Grandeur  Perception WDL  Hallucination None reported or observed  Judgment Limited  Confusion None  Danger to Self  Current suicidal ideation? Denies  Danger to Others  Danger to Others None reported or observed   Pt seen in his room. Pt has rapid pressured speech. Pt is upset that his wife had him IVC'd. Pt has been saying racially inappropriate things to staff. Pt has urinated on himself. Pt given towels and hygiene supplies to clean himself up. "I don't have bipolar. What's that? And they gonna pay for this crack in my hip. They brought me a urinal today but that don't do me no good. It's costing $1000 a day to my wife for me being here. 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 When I get home I'm gonna take that dog of hers to my brothers and tell her it got away." Pt is redirectable but it takes some effort. Pt denies SI, HI, AVH. Rates pain 9.5/10. Xray showed no fracture to the right hip. Visual inspection shows no bruising and skin is intact. Pt denies anxiety and depression. Pt given scheduled medications as prescribed. Q15 minute checks for safety. Pt safe on unit.

## 2022-03-27 NOTE — H&P (Signed)
Psychiatric Admission Assessment Adult ? ?Barrett Identification: Jorge Barrett ?MRN:  [id number] ?Date of Evaluation:  03/27/2022 ?Chief Complaint:  Bipolar 1 disorder ([place]) [F31.9] ?Principal Diagnosis: Bipolar 1 disorder ([place]) ?Diagnosis:  Principal Problem: ?  Bipolar 1 disorder ([place]) ? ?History of Present Illness: Jorge Barrett is a 76 year old white male who presents on an involuntary basis to Jorge geriatric psychiatry unit for pressured speech, aggressive behavior, not sleeping, and disorganized behavior.  Apparently, he was started on Ozempic about 2 months ago and has lost 20 pounds and family states this is when his behavior started because he does not have any psychiatric history although there is a statement of a history of bipolar disorder which he denies.  He has never seen a psychiatrist and has never been psychiatrically hospitalized.  He denies depression, auditory hallucinations, suicidal ideation or past suicide attempts.  He states that he feels great since he lost his 20 pounds.  He does present with pressured speech and his thought process is tangential and circumstantial.  He is pleasant and cooperative and did take his medications. ? ?PER INITIAL INTAKE: ?Jorge Barrett is a 76 y.o. male Barrett presented to [facility] ED via law enforcement under involuntary commitment status (IVC). Per Jorge ED triage nurse note, Pt presents via IVC with [place]. Pt has hx bipolar. IVC paperwork reports pt is aggressive, carrying a gun at all times outside Jorge hospital. It was reported that he was refusing to take medication.  ?Jorge Barrett has a hx of bipolar disorder. Jorge Barrett was aggressive; he was always carrying a gun outside of Jorge hospital. Reports were refusing to take medication. Of note, pt was agitated and highly erratic during his initial assessment. Jorge Barrett refuses to take any medication when Jorge nurse offers him. Jorge Barrett is threatening his wife and using disparaging language  toward his wife. Jorge Barrett is preoccupied with anger/resentment towards his wife for having him IVC'd, repeatedly calling her a dumb ass. Throughout Jorge assessment, pt. was impulsive and highly distractible.  ? ?This provider saw Jorge Barrett face-to-face; Jorge chart was reviewed, and consulted with Dr. Tamala Julian on 03/25/2022 due to Jorge Barrett's care. It was discussed with Jorge EDP that Jorge Barrett remained under observation overnight and will be reassessed in Jorge a.m. to determine if he meets Jorge criteria for psychiatric inpatient admission; he could be discharged home. ? ?On evaluation, Jorge Barrett is alert and oriented x4, angry, uncooperative, and mood-congruent with affect. Jorge Barrett does not appear to be responding to internal or external stimuli. Jorge Barrett is presenting with delusional thinking. Jorge Barrett denies auditory or visual hallucinations. Jorge Barrett is presenting with psychotic and paranoid behaviors. During an encounter with Jorge Barrett, he cannot answer most questions. ? ?Associated Signs/Symptoms: ?Depression Symptoms:  No ?Duration of Depression Symptoms: No data recorded ?(Hypo) Manic Symptoms:  Distractibility, ?Elevated Mood, ?Flight of Ideas, ?Grandiosity, ?Impulsivity, ?Irritable Mood, ?Anxiety Symptoms:  No ?Psychotic Symptoms:  No ?PTSD Symptoms:No ? ?Total Time spent with Barrett: 1 hour ? ?Past Psychiatric History: None ? ?Is Jorge Barrett at risk to self? Yes.    ?Has Jorge Barrett been a risk to self in Jorge past 6 months? Yes.    ?Has Jorge Barrett been a risk to self within Jorge distant past? No.  ?Is Jorge Barrett a risk to others? No.  ?Has Jorge Barrett been a risk to others in Jorge past 6 months? No.  ?Has Jorge Barrett been a risk to others within Jorge distant past?  No.  ? ?Prior Inpatient Therapy:   ?Prior Outpatient Therapy:   ? ?Alcohol Screening: 1. How often do you have a drink containing alcohol?: 2 to 3 times a week ?2. How many drinks containing alcohol do you have on a typical day  when you are drinking?: 1 or 2 ?3. How often do you have six or more drinks on one occasion?: Never ?AUDIT-C Score: 3 ?4. How often during Jorge last year have you found that you were not able to stop drinking once you had started?: Never ?5. How often during Jorge last year have you failed to do what was normally expected from you because of drinking?: Never ?6. How often during Jorge last year have you needed a first drink in Jorge morning to get yourself going after a heavy drinking session?: Never ?7. How often during Jorge last year have you had a feeling of guilt of remorse after drinking?: Never ?8. How often during Jorge last year have you been unable to remember what happened Jorge night before because you had been drinking?: Never ?9. Have you or someone else been injured as a result of your drinking?: No ?10. Has a relative or friend or a doctor or another health worker been concerned about your drinking or suggested you cut down?: No ?Alcohol Use Disorder Identification Test Final Score (AUDIT): 3 ?Substance Abuse History in Jorge last 12 months:  No. ?Consequences of Substance Abuse: ?NA ?Previous Psychotropic Medications: No  ?Psychological Evaluations: No  ?Past Medical History:  ?Past Medical History:  ?Diagnosis Date  ? Diabetes mellitus without complication ([place])   ? High cholesterol   ? Hypertension   ? Hypothyroidism   ? History reviewed. No pertinent surgical history. ?Family History: History reviewed. No pertinent family history. ?Family Psychiatric  History: No ?Tobacco Screening:   ?Social History:  ?Social History  ? ?Substance and Sexual Activity  ?Alcohol Use Yes  ? Alcohol/week: 2.0 standard drinks  ? Types: 2 Shots of liquor per week  ? Comment: 1-2 shots at least twice a week  ?   ?Social History  ? ?Substance and Sexual Activity  ?Drug Use Not Currently  ?  ?Additional Social History: ?  ?   ?  ?  ?  ?  ?  ?  ?  ?  ?  ?  ? ?Allergies:   ?Allergies  ?Allergen Reactions  ? Sulfur   ?  Other reaction(s):  Unknown ?Other reaction(s): Unknown ?  ? Trimethoprim Rash  ? ?Lab Results:  ?Results for orders placed or performed during Jorge hospital encounter of 03/26/22 (from Jorge past 48 hour(s))  ?Glucose, capillary     Status: Abnormal  ? Collection Time: 03/26/22  2:37 PM  ?Result Value Ref Range  ? Glucose-Capillary 103 (H) 70 - 99 mg/dL  ?  Comment: Glucose reference range applies only to samples taken after fasting for at least 8 hours.  ? ? ?Blood Alcohol level:  ?Lab Results  ?Component Value Date  ? ETH <10 03/25/2022  ? ? ?Metabolic Disorder Labs:  ?No results found for: HGBA1C, MPG ?No results found for: PROLACTIN ?No results found for: CHOL, TRIG, HDL, CHOLHDL, VLDL, LDLCALC ? ?Current Medications: ?Current Facility-Administered Medications  ?Medication Dose Route Frequency Provider Last Rate Last Admin  ? acetaminophen (TYLENOL) tablet 650 mg  650 mg Oral Q6H PRN Sherlon Handing, NP   650 mg at 03/27/22 [id number]  ? alum & mag hydroxide-simeth (MAALOX/MYLANTA) 200-200-20 MG/5ML suspension 30 mL  30 mL  Oral Q4H PRN Waldon Merl F, NP      ? feeding supplement (GLUCERNA SHAKE) (GLUCERNA SHAKE) liquid 237 mL  237 mL Oral TID BM Parks Ranger, DO   237 mL at [id number]  ? folic acid (FOLVITE) tablet 1 mg  1 mg Oral Daily Waldon Merl F, NP   1 mg at 03/27/22 0913  ? LORazepam (ATIVAN) tablet 1 mg  1 mg Oral Q6H PRN Waldon Merl F, NP   1 mg at 03/26/22 2146  ? magnesium hydroxide (MILK OF MAGNESIA) suspension 30 mL  30 mL Oral Daily PRN Sherlon Handing, NP      ? multivitamin with minerals tablet 1 tablet  1 tablet Oral Daily Sherlon Handing, NP   1 tablet at 03/27/22 0913  ? OLANZapine (ZYPREXA) tablet 5 mg  5 mg Oral QHS Waldon Merl F, NP   5 mg at 03/26/22 2147  ? thiamine tablet 100 mg  100 mg Oral Daily Waldon Merl F, NP   100 mg at 03/27/22 0913  ? Or  ? thiamine (B-1) injection 100 mg  100 mg Intravenous Daily Sherlon Handing, NP      ? ?PTA Medications: ?Medications  Prior to Admission  ?Medication Sig Dispense Refill Last Dose  ? allopurinol (ZYLOPRIM) 100 MG tablet Take 100 mg by mouth daily.   03/25/2022  ? amLODipine (NORVASC) 10 MG tablet Take 10 mg by mouth daily

## 2022-03-27 NOTE — Progress Notes (Signed)
Recreation Therapy Notes ? ?  ?Date: 03/27/2022 ?  ?Time: 1:35pm   ?  ?Location: Craft room ?  ?Behavioral response: Appropriate, Hyperverbal  ?  ?Intervention Topic: Self-care ?  ?Discussion/Intervention:  ?Group content today was focused on Self-Care. The group defined self-care and some positive ways they care for themselves. Individuals expressed ways and reasons why they neglected any self-care in the past. Patients described ways to improve self-care in the future. The group explained what could happen if they did not do any self-care activities at all. The group participated in the intervention ?self-care assessment? where they had a chance to discover some of their weaknesses and strengths in self- care. Patient came up with a self-care plan to improve themselves in the future.  ?Clinical Observations/Feedback: ?Patient came to group and defined self-care as taking care of self. He explained that he participates in self-care by wearing seat belt, drink water and clean. Participant was on and off topic and needed redirection to focus on the topic at hand. Individual was social with peers and staff while participating in the intervention.    ?Jorge Barrett LRT/CTRS  ? ? ? ? ? ? ? ?Jorge Barrett ?03/27/2022 2:37 PM ?

## 2022-03-27 NOTE — BH IP Treatment Plan (Signed)
Interdisciplinary Treatment and Diagnostic Plan Update ? ?03/27/2022 ?Time of Session: 10:30AM ?Jorge Barrett ?MRN: [id number] ? ?Principal Diagnosis: Bipolar 1 disorder ([place]) ? ?Secondary Diagnoses: Principal Problem: ?  Bipolar 1 disorder ([place]) ? ? ?Current Medications:  ?Current Facility-Administered Medications  ?Medication Dose Route Frequency Provider Last Rate Last Admin  ? acetaminophen (TYLENOL) tablet 650 mg  650 mg Oral Q6H PRN Sherlon Handing, NP   650 mg at 03/27/22 3817  ? alum & mag hydroxide-simeth (MAALOX/MYLANTA) 200-200-20 MG/5ML suspension 30 mL  30 mL Oral Q4H PRN Waldon Merl F, NP      ? feeding supplement (GLUCERNA SHAKE) (GLUCERNA SHAKE) liquid 237 mL  237 mL Oral TID BM Parks Ranger, DO   237 mL at 71/16/57 9038  ? folic acid (FOLVITE) tablet 1 mg  1 mg Oral Daily Waldon Merl F, NP   1 mg at 03/27/22 0913  ? LORazepam (ATIVAN) tablet 1 mg  1 mg Oral Q6H PRN Waldon Merl F, NP   1 mg at 03/26/22 2146  ? magnesium hydroxide (MILK OF MAGNESIA) suspension 30 mL  30 mL Oral Daily PRN Sherlon Handing, NP      ? multivitamin with minerals tablet 1 tablet  1 tablet Oral Daily Sherlon Handing, NP   1 tablet at 03/27/22 0913  ? QUEtiapine (SEROQUEL) tablet 100 mg  100 mg Oral QHS Parks Ranger, DO      ? temazepam (RESTORIL) capsule 30 mg  30 mg Oral QHS PRN Parks Ranger, DO      ? thiamine tablet 100 mg  100 mg Oral Daily Waldon Merl F, NP   100 mg at 03/27/22 0913  ? Or  ? thiamine (B-1) injection 100 mg  100 mg Intravenous Daily Sherlon Handing, NP      ? ?PTA Medications: ?Medications Prior to Admission  ?Medication Sig Dispense Refill Last Dose  ? allopurinol (ZYLOPRIM) 100 MG tablet Take 100 mg by mouth daily.   03/25/2022  ? amLODipine (NORVASC) 10 MG tablet Take 10 mg by mouth daily.   03/25/2022  ? divalproex (DEPAKOTE) 500 MG DR tablet Take 500 mg by mouth 2 (two) times daily.   03/25/2022  ? levothyroxine (SYNTHROID) 75 MCG  tablet Take 75 mcg by mouth daily.   03/25/2022  ? lisinopril (ZESTRIL) 40 MG tablet Take 40 mg by mouth daily.   03/25/2022  ? omeprazole (PRILOSEC) 20 MG capsule Take 20 mg by mouth daily.   03/25/2022  ? rosuvastatin (CRESTOR) 10 MG tablet Take 10 mg by mouth daily.   03/25/2022  ? Semaglutide, 2 MG/DOSE, 8 MG/3ML SOPN Inject 2 mg into the skin once a week.   Past Week  ? tadalafil (CIALIS) 10 MG tablet Take 10 mg by mouth as needed.   Unknown  ? tamsulosin (FLOMAX) 0.4 MG CAPS capsule Take 0.4 mg by mouth daily.   More than a month  ? ? ?Patient Stressors:   ? ?Patient Strengths:   ? ?Treatment Modalities: Medication Management, Group therapy, Case management,  ?1 to 1 session with clinician, Psychoeducation, Recreational therapy. ? ? ?Physician Treatment Plan for Primary Diagnosis: Bipolar 1 disorder ([place]) ?Long Term Goal(s): Improvement in symptoms so as ready for discharge  ? ?Short Term Goals: Ability to identify changes in lifestyle to reduce recurrence of condition will improve ?Ability to verbalize feelings will improve ?Ability to disclose and discuss suicidal ideas ?Ability to demonstrate self-control will improve ?Ability to identify and develop effective  coping behaviors will improve ?Ability to maintain clinical measurements within normal limits will improve ?Compliance with prescribed medications will improve ?Ability to identify triggers associated with substance abuse/mental health issues will improve ? ?Medication Management: Evaluate patient's response, side effects, and tolerance of medication regimen. ? ?Therapeutic Interventions: 1 to 1 sessions, Unit Group sessions and Medication administration. ? ?Evaluation of Outcomes: Not Met ? ?Physician Treatment Plan for Secondary Diagnosis: Principal Problem: ?  Bipolar 1 disorder ([place]) ? ?Long Term Goal(s): Improvement in symptoms so as ready for discharge  ? ?Short Term Goals: Ability to identify changes in lifestyle to reduce recurrence of condition  will improve ?Ability to verbalize feelings will improve ?Ability to disclose and discuss suicidal ideas ?Ability to demonstrate self-control will improve ?Ability to identify and develop effective coping behaviors will improve ?Ability to maintain clinical measurements within normal limits will improve ?Compliance with prescribed medications will improve ?Ability to identify triggers associated with substance abuse/mental health issues will improve    ? ?Medication Management: Evaluate patient's response, side effects, and tolerance of medication regimen. ? ?Therapeutic Interventions: 1 to 1 sessions, Unit Group sessions and Medication administration. ? ?Evaluation of Outcomes: Not Met ? ? ?RN Treatment Plan for Primary Diagnosis: Bipolar 1 disorder ([place]) ?Long Term Goal(s): Knowledge of disease and therapeutic regimen to maintain health will improve ? ?Short Term Goals: Ability to remain free from injury will improve, Ability to verbalize frustration and anger appropriately will improve, Ability to demonstrate self-control, Ability to participate in decision making will improve, Ability to verbalize feelings will improve, Ability to identify and develop effective coping behaviors will improve, and Compliance with prescribed medications will improve ? ?Medication Management: RN will administer medications as ordered by provider, will assess and evaluate patient's response and provide education to patient for prescribed medication. RN will report any adverse and/or side effects to prescribing provider. ? ?Therapeutic Interventions: 1 on 1 counseling sessions, Psychoeducation, Medication administration, Evaluate responses to treatment, Monitor vital signs and CBGs as ordered, Perform/monitor CIWA, COWS, AIMS and Fall Risk screenings as ordered, Perform wound care treatments as ordered. ? ?Evaluation of Outcomes: Not Met ? ? ?LCSW Treatment Plan for Primary Diagnosis: Bipolar 1 disorder ([place]) ?Long Term Goal(s): Safe  transition to appropriate next level of care at discharge, Engage patient in therapeutic group addressing interpersonal concerns. ? ?Short Term Goals: Engage patient in aftercare planning with referrals and resources, Increase social support, Increase ability to appropriately verbalize feelings, Increase emotional regulation, Facilitate acceptance of mental health diagnosis and concerns, and Increase skills for wellness and recovery ? ?Therapeutic Interventions: Assess for all discharge needs, 1 to 1 time with Education officer, museum, Explore available resources and support systems, Assess for adequacy in community support network, Educate family and significant other(s) on suicide prevention, Complete Psychosocial Assessment, Interpersonal group therapy. ? ?Evaluation of Outcomes: Not Met ? ? ?Progress in Treatment: ?Attending groups: No. ?Participating in groups: No. ?Taking medication as prescribed: Yes. ?Toleration medication: Yes. ?Family/Significant other contact made: No, will contact:  When given permission ?Patient understands diagnosis: No. ?Discussing patient identified problems/goals with staff: Yes. ?Medical problems stabilized or resolved: Yes. ?Denies suicidal/homicidal ideation: Yes. ?Issues/concerns per patient self-inventory: No. ?Other: None ? ?New problem(s) identified: No, Describe:  None ? ?New Short Term/Long Term Goal(s): Patient to work towards elimination of symptoms of psychosis, medication management for mood stabilization; development of comprehensive mental wellness plan. ? ? ?Patient Goals:  "not supposed to be here" ? ?Discharge Plan or Barriers: CSW will assist pt with development  of appropriate discharge/aftercare plan.  ? ?Reason for Continuation of Hospitalization: Delusions  ?Mania ?Medication stabilization ? ?Estimated Length of Stay: TBD ? ?Last 3 [place] Suicide Severity Risk Score: ?[place] Admission (Current) from 03/26/2022 in [facility] from  03/25/2022 in [facility]  ?C-SSRS RISK CATEGORY No Risk No Risk  ? ?  ? ? ?Last PHQ 2/9 Scores: ?   ? View : No data to display.  ?  ?  ?  ? ? ?Scribe for Treatment Tea

## 2022-03-28 DIAGNOSIS — F319 Bipolar disorder, unspecified: Secondary | ICD-10-CM | POA: Diagnosis not present

## 2022-03-28 MED ORDER — LORAZEPAM 1 MG PO TABS: 1.0000 mg | ORAL_TABLET | ORAL | Status: DC | PRN

## 2022-03-28 MED ORDER — OLANZAPINE 5 MG PO TABS: 10.0000 mg | ORAL_TABLET | Freq: Four times a day (QID) | ORAL | Status: DC | PRN

## 2022-03-28 MED ORDER — DIVALPROEX SODIUM 250 MG PO DR TAB
500.0000 mg | DELAYED_RELEASE_TABLET | ORAL | Status: DC
  Administered 2022-03-28 – 2022-04-02 (×11): 500 mg via ORAL
  Filled 2022-03-28 (×12): qty 2

## 2022-03-28 NOTE — Plan of Care (Signed)
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 Denies anxiety and depression. Denies SI, HI, and AVH. Ate breakfast in the day room among peers with good appetite. 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 Patient is angry and irritable requires constant redirections.   Problem: Education: Goal: Knowledge of General Education information will improve Description: Including pain rating scale, medication(s)/side effects and non-pharmacologic comfort measures Outcome: Progressing   Problem: Health Behavior/Discharge Planning: Goal: Ability to manage health-related needs will improve Outcome: Progressing   Problem: Clinical Measurements: Goal: Ability to maintain clinical measurements within normal limits will improve Outcome: Progressing Goal: Will remain free from infection Outcome: Progressing Goal: Diagnostic test results will improve Outcome: Progressing Goal: Respiratory complications will improve Outcome: Progressing Goal: Cardiovascular complication will be avoided Outcome: Progressing   Problem: Activity: Goal: Risk for activity intolerance will decrease Outcome: Progressing   Problem: Nutrition: Goal: Adequate nutrition will be maintained Outcome: Progressing   Problem: Coping: Goal: Level of anxiety will decrease Outcome: Progressing   Problem: Elimination: Goal: Will not experience complications related to bowel motility Outcome: Progressing Goal: Will not experience complications related to urinary retention Outcome: Progressing   Problem: Pain Managment: Goal: General experience of comfort will improve Outcome: Progressing   Problem: Safety: Goal: Ability to remain free from injury will improve Outcome: Progressing   Problem: Skin Integrity: Goal: Risk for impaired skin integrity will decrease Outcome: Progressing

## 2022-03-28 NOTE — Progress Notes (Signed)
Recreation Therapy Notes  Date: 03/28/2022   Time: 2:30pm     Location: Craft room    Behavioral response: N/A   Intervention Topic: Values    Discussion/Intervention: Patient refused to attend group.    Clinical Observations/Feedback:  Patient refused to attend group.    Alessander Sikorski LRT/CTRS        Sabino Denning 03/28/2022 2:32 PM

## 2022-03-28 NOTE — Progress Notes (Signed)
Recreation Therapy Notes  INPATIENT RECREATION THERAPY ASSESSMENT  Patient Details Name: Jorge Barrett MRN: [id number] DOB: May 10, 1946 Today's Date: 03/28/2022       Information Obtained From: Patient (Patient unable)  Able to Participate in Assessment/Interview:    Patient Presentation:    Reason for Admission (Per Patient):    Patient Stressors:    Coping Skills:      Leisure Interests (2+):     Frequency of Recreation/Participation:    Awareness of Community Resources:     [employer]:     Current Use:    If no, Barriers?:    Expressed Interest in [employer] Information:    [place] of Residence:     Patient Main Form of Transportation:    Patient Strengths:     Patient Identified Areas of Improvement:     Patient Goal for Hospitalization:     Current SI (including self-harm):     Current HI:     Current AVH:    Staff Intervention Plan:    Consent to Intern Participation:    Rosamary Boudreau 03/28/2022, 4:01 PM

## 2022-03-28 NOTE — Progress Notes (Signed)
During 15 minute check, pt has urinated on the bed and the floor. "I need my clothes. I'm coming up there and get my clothes. I need my regular clothes. I ain't dealing with this shit." Pt educated on use of urinal by his bedside. "I didn't have time. I woke up and started peeing. Did you give me Flomax? That stuff has me peeing all night." Pt told that he has not been given Flomax. "I can't use the bathroom with these pants as bad as I have to go." Pt offered a gown and he refused. Pt told that he was given the clothing he could have when he was admitted. "I just have to pull down the zipper and I can use the urinal." Pt also re-educated to use his walker when ambulating. Pt verbalized understanding. Pt had urinated in his bed so bed linen changed and towels used to clean up urine on the floor.

## 2022-03-28 NOTE — Progress Notes (Signed)
During 15 minute checks, it was noticed that there were urine puddles on the floor. Pt sitting on side of bed. Pt again educated about using his urinal instead of trying to make it to the bathroom. Pt speaking rapidly and tangentially. Urine cleaned up and pt encouraged to lay down and get some rest. Will continue to monitor.

## 2022-03-28 NOTE — Progress Notes (Signed)
Recreation Therapy Notes  INPATIENT RECREATION TR PLAN  Patient Details Name: Jorge Barrett MRN: [id number] DOB: 1946/06/09 Today's Date: 03/28/2022  Rec Therapy Plan Is patient appropriate for Therapeutic Recreation?: Yes Treatment times per week: at least 3 Estimated Length of Stay: 5-7 days TR Treatment/Interventions: Group participation (Comment)  Discharge Criteria Pt will be discharged from therapy if:: Discharged Treatment plan/goals/alternatives discussed and agreed upon by:: Patient/family  Discharge Summary     [name] 03/28/2022, 4:01 PM

## 2022-03-28 NOTE — BHH Counselor (Signed)
Adult Comprehensive Assessment  Patient ID: Jorge Barrett, male   DOB: 1945/12/31, 76 y.o.   MRN: [id number]  Information Source: Information source: Patient  Current Stressors:  Patient states their primary concerns and needs for treatment are:: "wife did this" Patient states their goals for this hospitilization and ongoing recovery are:: "nothing to work on, it's herTherapist, music / Learning stressors: Pt denies Employment / Job issues: Pt denies Family Relationships: "wife worrying mePublishing copy / Lack of resources (include bankruptcy): Pt denies Housing / Lack of housing: Pt denies Physical health (include injuries & life threatening diseases): Diabetes Social relationships: Pt denies Substance abuse: Pt denies Bereavement / Loss: "brother-in-law passed away 2 years ago"  Living/Environment/Situation:  Living Arrangements: Spouse/significant other Living conditions (as described by patient or guardian): "been good, except 10 years ago" How long has patient lived in current situation?: 17 years What is atmosphere in current home: Comfortable  Family History:  Marital status: Married Number of Years Married: 42 What types of issues is patient dealing with in the relationship?: "wife doesn't tell the truth" Are you sexually active?: Yes What is your sexual orientation?: Heterosexual Has your sexual activity been affected by drugs, alcohol, medication, or emotional stress?: Pt denies Does patient have children?: Yes How many children?: 2 (1 brother, 1 sister) How is patient's relationship with their children?: Pt states that he has a good relationship with his children  Childhood History:  By whom was/is the patient raised?: Both parents Additional childhood history information: "father was a Engineer, civil (consulting) Description of patient's relationship with caregiver when they were a child: Pt states that he had good relationship with his parents Patient's description of  current relationship with people who raised him/her: Deceased How were you disciplined when you got in trouble as a child/adolescent?: "whoopings" Does patient have siblings?: Yes Number of Siblings: 46 (5 brothers, 3 sisters) Description of patient's current relationship with siblings: Pt states that he talks with some of his siblings Did patient suffer any verbal/emotional/physical/sexual abuse as a child?: No Did patient suffer from severe childhood neglect?: No Has patient ever been sexually abused/assaulted/raped as an adolescent or adult?: No Was the patient ever a victim of a crime or a disaster?: No Witnessed domestic violence?: No Has patient been affected by domestic violence as an adult?: No  Education:  Highest grade of school patient has completed: 9th grade Currently a student?: No Learning disability?: No  Employment/Work Situation:   Employment Situation:  (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, government has Been Impacted by Current Illness: No What is the Longest Time Patient has Held a Job?: 31 years Where was the Patient Employed at that Time?: Pt states he worked at [employer] Patient ever Been in the [employer]?: No  Financial Resources:   Financial resources: [employer], Marine scientist SSI Does patient have a Programmer, applications or guardian?: No  Alcohol/Substance Abuse:   What has been your use of drugs/alcohol within the last 12 months?: Pt denies If attempted suicide, did drugs/alcohol play a role in this?: No Alcohol/Substance Abuse Treatment Hx: Denies past history Has alcohol/substance abuse ever caused legal problems?: No  Social Support System:   Pensions consultant Support System: Good Describe Community Support System: Pt states that his daughter, son and "buddies" Type of faith/religion: Passenger transport manager" How does patient's faith help to cope with current illness?: "thank the [employer] everyday"  Leisure/Recreation:   Do You Have Hobbies?: Yes Leisure and  Hobbies: "like to ride off somewhere, shoot the  bull, hang with my buddies"  Strengths/Needs:   What is the patient's perception of their strengths?: "draw, use a chainsaw" Patient states they can use these personal strengths during their treatment to contribute to their recovery: "do it yourself kinda guy" Patient states these barriers may affect/interfere with their treatment: Pt denies Patient states these barriers may affect their return to the community: Pt denies  Discharge Plan:   Currently receiving community mental health services: No Patient states concerns and preferences for aftercare planning are: "i don't need that" Patient states they will know when they are safe and ready for discharge when: "wanna go home, make some money" Does patient have access to transportation?: Yes Does patient have financial barriers related to discharge medications?: No Will patient be returning to same living situation after discharge?: Yes  Summary/Recommendations:   Summary and Recommendations (to be completed by the evaluator): Patient is a 76 year old male in married [place] [facility] [place] He reports that he receives SSI and is self-employed part-time.  He presents to the hospital involuntarily following aggressive behaviors in the community, flashing a gun in public, and exhibiting tangential, verbose speech. Recent stressors incldue marital strife and recent behavior changes of mania. He has a primary diagnosis of Bipolar 1 disorder . Patient has no aftercare plan in place and is declined a referral to community mental health services. Patient is agitated but cooperative on assessment. Patient has tangential speech with agitated mood, affect is congruent. Patient states that he has been on diabetic medication which he contributes to his weight loss and "feeling great." Patient has a provider with [facility].  Recommendations include: crisis stabilization, therapeutic  milieu, encourage group attendance and participation, medication management for mood stabilization and development of comprehensive mental wellness plan.  Karma Hiney A [place]. 03/28/2022

## 2022-03-28 NOTE — Progress Notes (Signed)
[facility] MD Progress Note  03/28/2022 10:42 AM Jorge Barrett  MRN:  [id number] Subjective: Jorge Barrett is seen on rounds.  He needs a lot of redirection.  He continues to have pressured and tangential speech.  He has been compliant with his medications and I do believe that he slept last night.  He still very irritable and agitated and wants to leave.  I told him that he is not ready.  Principal Problem: Bipolar 1 disorder (HCC) Diagnosis: Principal Problem:   Bipolar 1 disorder (HCC)  Total Time spent with patient: 15 minutes  Past Psychiatric History: None  Past Medical History:  Past Medical History:  Diagnosis Date   Diabetes mellitus without complication (HCC)    High cholesterol    Hypertension    Hypothyroidism    History reviewed. No pertinent surgical history. Family History: History reviewed. No pertinent family history.  Social History:  Social History   Substance and Sexual Activity  Alcohol Use Yes   Alcohol/week: 2.0 standard drinks   Types: 2 Shots of liquor per week   Comment: 1-2 shots at least twice a week     Social History   Substance and Sexual Activity  Drug Use Not Currently    Social History   Socioeconomic History   Marital status: Married    Spouse name: Not on file   Number of children: 2   Years of education: 9   Highest education level: 9th grade  Occupational History   Not on file  Tobacco Use   Smoking status: Former    Types: Cigarettes   Smokeless tobacco: Former    Types: Chew   Tobacco comments:    Stopped smoking and chewing tobacco years ago  Building services engineer Use: Never used  Substance and Sexual Activity   Alcohol use: Yes    Alcohol/week: 2.0 standard drinks    Types: 2 Shots of liquor per week    Comment: 1-2 shots at least twice a week   Drug use: Not Currently   Sexual activity: Not on file  Other Topics Concern   Not on file  Social History Narrative   Married, lives with wife, has 2 adult children, 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   Social Determinants of Health   Financial Resource Strain: Not on file  Food Insecurity: Not on file  Transportation Needs: Not on file  Physical Activity: Not on file  Stress: Not on file  Social Connections: Not on file   Additional Social History:                         Sleep: Fair  Appetite:  Fair  Current Medications: Current Facility-Administered Medications  Medication Dose Route Frequency Provider Last Rate Last Admin   acetaminophen (TYLENOL) tablet 650 mg  650 mg Oral Q6H PRN Jorge Barrett F, NP   650 mg at 03/27/22 2113   alum & mag hydroxide-simeth (MAALOX/MYLANTA) 200-200-20 MG/5ML suspension 30 mL  30 mL Oral Q4H PRN Jorge Barrett F, NP       divalproex (DEPAKOTE) DR tablet 500 mg  500 mg Oral BH-q8a4p Jorge Calles Edward, DO       feeding supplement (GLUCERNA SHAKE) (GLUCERNA SHAKE) liquid 237 mL  237 mL Oral TID BM Jorge Ill, DO   237 mL at 03/28/22 0939   folic acid (FOLVITE) tablet 1 mg  1 mg Oral Daily Jorge Barrett F, NP   1 mg at 03/28/22 [id number]  LORazepam (ATIVAN) tablet 1 mg  1 mg Oral Q4H PRN Jorge Ill, DO       magnesium hydroxide (MILK OF MAGNESIA) suspension 30 mL  30 mL Oral Daily PRN Jorge Mulders, NP       multivitamin with minerals tablet 1 tablet  1 tablet Oral Daily Jorge Barrett F, NP   1 tablet at 03/28/22 0932   OLANZapine (ZYPREXA) tablet 10 mg  10 mg Oral Q6H PRN Jorge Ill, DO       QUEtiapine (SEROQUEL) tablet 100 mg  100 mg Oral QHS Jorge Ill, DO   100 mg at 03/27/22 2113   temazepam (RESTORIL) capsule 30 mg  30 mg Oral QHS PRN Jorge Ill, DO       thiamine tablet 100 mg  100 mg Oral Daily Jorge Barrett F, NP   100 mg at 03/28/22 9449   Or   thiamine (B-1) injection 100 mg  100 mg Intravenous Daily Jorge Barrett F, NP        Lab Results:  Results for orders placed or performed during the hospital encounter  of 03/26/22 (from the past 48 hour(s))  Glucose, capillary     Status: Abnormal   Collection Time: 03/26/22  2:37 PM  Result Value Ref Range   Glucose-Capillary 103 (H) 70 - 99 mg/dL    Comment: Glucose reference range applies only to samples taken after fasting for at least 8 hours.    Blood Alcohol level:  Lab Results  Component Value Date   ETH <10 03/25/2022    Metabolic Disorder Labs: No results found for: HGBA1C, MPG No results found for: PROLACTIN No results found for: CHOL, TRIG, HDL, CHOLHDL, VLDL, LDLCALC  Physical Findings: AIMS:  , ,  ,  ,    CIWA:  CIWA-Ar Total: 3 COWS:     Musculoskeletal: Strength & Muscle Tone: within normal limits Gait & Station: unsteady Patient leans: N/A  Psychiatric Specialty Exam:  Presentation  General Appearance: Bizarre  Eye Contact:Good  Speech:Pressured  Speech Volume:Increased  Handedness:Right   Mood and Affect  Mood:Irritable  Affect:Inappropriate; Full Range   Thought Process  Thought Processes:Disorganized  Descriptions of Associations:Tangential  Orientation:Full (Time, Place and Person)  Thought Content:Illogical; Tangential  History of Schizophrenia/Schizoaffective disorder:No  Duration of Psychotic Symptoms:Greater than six months  Hallucinations:No data recorded Ideas of Reference:None  Suicidal Thoughts:No data recorded Homicidal Thoughts:No data recorded  Sensorium  Memory:Immediate Poor; Recent Fair; Remote Fair  Judgment:Impaired  Insight:Poor   Executive Functions  Concentration:Poor  Attention Span:Poor  Recall:Poor  Fund of Knowledge:Poor  Language:Poor   Psychomotor Activity  Psychomotor Activity:No data recorded  Assets  Assets:Desire for Improvement; Resilience; Social Support   Sleep  Sleep:No data recorded   Physical Exam: Physical Exam Vitals and nursing note reviewed.  Constitutional:      Appearance: Normal appearance. He is normal weight.   Neurological:     General: No focal deficit present.     Mental Status: He is alert and oriented to person, place, and time.  Psychiatric:        Attention and Perception: Attention and perception normal.        Mood and Affect: Mood is elated. Affect is angry.        Speech: Speech is rapid and pressured.        Behavior: Behavior is agitated.        Thought Content: Thought content normal.        Cognition  and Memory: Cognition and memory normal.        Judgment: Judgment is impulsive and inappropriate.   Review of Systems  Constitutional: Negative.   HENT: Negative.    Eyes: Negative.   Respiratory: Negative.    Cardiovascular: Negative.   Gastrointestinal: Negative.   Genitourinary: Negative.   Musculoskeletal: Negative.   Skin: Negative.   Neurological: Negative.   Endo/Heme/Allergies: Negative.   Psychiatric/Behavioral:  The patient is nervous/anxious.   Blood pressure 137/79, pulse 94, temperature 98.2 F (36.8 C), temperature source Oral, resp. rate (!) 22, height 6' (1.829 m), weight 107 kg, SpO2 97 %. Body mass index is 32.01 kg/m.   Treatment Plan Summary: Daily contact with patient to assess and evaluate symptoms and progress in treatment, Medication management, and Plan start Depakote 500 mg twice a day.  Jorge Illichard Barrett Brylon Brenning, DO 03/28/2022, 10:42 AM

## 2022-03-29 DIAGNOSIS — F319 Bipolar disorder, unspecified: Secondary | ICD-10-CM | POA: Diagnosis not present

## 2022-03-29 MED ORDER — IBUPROFEN 200 MG PO TABS
400.0000 mg | ORAL_TABLET | Freq: Four times a day (QID) | ORAL | Status: DC | PRN
  Administered 2022-03-29 – 2022-04-03 (×6): 400 mg via ORAL
  Filled 2022-03-29 (×6): qty 2

## 2022-03-29 MED ORDER — METFORMIN HCL ER 500 MG PO TB24
500.0000 mg | ORAL_TABLET | Freq: Two times a day (BID) | ORAL | Status: DC
  Administered 2022-03-29 – 2022-04-04 (×13): 500 mg via ORAL
  Filled 2022-03-29 (×15): qty 1

## 2022-03-29 MED ORDER — ROSUVASTATIN CALCIUM 5 MG PO TABS
10.0000 mg | ORAL_TABLET | Freq: Every day | ORAL | Status: DC
  Administered 2022-03-29 – 2022-04-04 (×7): 10 mg via ORAL
  Filled 2022-03-29 (×7): qty 2

## 2022-03-29 MED ORDER — PANTOPRAZOLE SODIUM 40 MG PO TBEC
40.0000 mg | DELAYED_RELEASE_TABLET | Freq: Every day | ORAL | Status: DC
  Administered 2022-03-29 – 2022-04-04 (×7): 40 mg via ORAL
  Filled 2022-03-29 (×7): qty 1

## 2022-03-29 MED ORDER — AMLODIPINE BESYLATE 5 MG PO TABS
10.0000 mg | ORAL_TABLET | Freq: Every day | ORAL | Status: DC
  Administered 2022-03-29 – 2022-04-04 (×6): 10 mg via ORAL
  Filled 2022-03-29 (×7): qty 2

## 2022-03-29 MED ORDER — LISINOPRIL 20 MG PO TABS
40.0000 mg | ORAL_TABLET | Freq: Every day | ORAL | Status: DC
  Administered 2022-03-29 – 2022-04-04 (×6): 40 mg via ORAL
  Filled 2022-03-29 (×6): qty 2

## 2022-03-29 NOTE — Plan of Care (Signed)
Patient remain alert, restless and labile. Patient denies SI, HI, and AVH. Denies anxiety and depression. Complain of soreness to his right hip. Ate meals in the day room among peers with good appetite. Compliant with all due medications. Ambulate with front wheel walker. Occasional incontinence. Patient is currently in his room resting in no apparent distress with Q15 minute safety checks.  Problem: Education: Goal: Knowledge of General Education information will improve Description: Including pain rating scale, medication(s)/side effects and non-pharmacologic comfort measures Outcome: Progressing   Problem: Health Behavior/Discharge Planning: Goal: Ability to manage health-related needs will improve Outcome: Progressing   Problem: Clinical Measurements: Goal: Ability to maintain clinical measurements within normal limits will improve Outcome: Progressing Goal: Will remain free from infection Outcome: Progressing Goal: Diagnostic test results will improve Outcome: Progressing Goal: Respiratory complications will improve Outcome: Progressing Goal: Cardiovascular complication will be avoided Outcome: Progressing   Problem: Activity: Goal: Risk for activity intolerance will decrease Outcome: Progressing   Problem: Nutrition: Goal: Adequate nutrition will be maintained Outcome: Progressing   Problem: Coping: Goal: Level of anxiety will decrease Outcome: Progressing   Problem: Elimination: Goal: Will not experience complications related to bowel motility Outcome: Progressing Goal: Will not experience complications related to urinary retention Outcome: Progressing   Problem: Pain Management: Goal: General experience of comfort will improve Outcome: Progressing   Problem: Safety: Goal: Ability to remain free from injury will improve Outcome: Progressing   Problem: Skin Integrity: Goal: Risk for impaired skin integrity will decrease Outcome: Progressing

## 2022-03-29 NOTE — Progress Notes (Signed)
[facility]  03/29/2022 10:25 AM Vonita MossJimmy David Barrett  MRN:  [id number] Subjective:  Jorge AshingJim was seen on rounds today.  His blood pressure medicine and GERD medication were reordered.  He is on 1 of those new injection medications for her diabetes which I think contributed to his mania after losing 20 pounds.  We will put him on metformin in the meantime.  He seems to be calmer this morning since starting Depakote.  The nurses tell me that he did sleep last night.  He seems to be less agitated and more cooperative.  He did sign voluntarily.  Principal Problem: Bipolar 1 disorder (HCC) Diagnosis: Principal Problem:   Bipolar 1 disorder (HCC)  Total Time spent with patient: 15 minutes  Past Psychiatric History: No  Past Medical History:  Past Medical History:  Diagnosis Date   Diabetes mellitus without complication (HCC)    High cholesterol    Hypertension    Hypothyroidism    History reviewed. No pertinent surgical history. Family History: History reviewed. No pertinent family history. Family Psychiatric  History: No Social History:  Social History   Substance and Sexual Activity  Alcohol Use Yes   Alcohol/week: 2.0 standard drinks   Types: 2 Shots of liquor per week   Comment: 1-2 shots at least twice a week     Social History   Substance and Sexual Activity  Drug Use Not Currently    Social History   Socioeconomic History   Marital status: Married    Spouse name: Not on file   Number of children: 2   Years of education: 9   Highest education level: 9th grade  Occupational History   Not on file  Tobacco Use   Smoking status: Former    Types: Cigarettes   Smokeless tobacco: Former    Types: Chew   Tobacco comments:    Stopped smoking and chewing tobacco years ago  Building services engineerVaping Use   Vaping Use: Never used  Substance and Sexual Activity   Alcohol use: Yes    Alcohol/week: 2.0 standard drinks    Types: 2 Shots of liquor per week    Comment: 1-2 shots at least  twice a week   Drug use: Not Currently   Sexual activity: Not on file  Other Topics Concern   Not on file  Social History Narrative   Married, lives with wife, 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   Social Determinants of Health   Financial Resource Strain: Not on file  Food Insecurity: Not on file  Transportation Needs: Not on file  Physical Activity: Not on file  Stress: Not on file  Social Connections: Not on file   Additional Social History:                         Sleep: Good  Appetite:  Good  Current Medications: Current Facility-Administered Medications  Medication Dose Route Frequency Provider Last Rate Last Admin   alum & mag hydroxide-simeth (MAALOX/MYLANTA) 200-200-20 MG/5ML suspension 30 mL  30 mL Oral Q4H PRN Gabriel CirriBarthold, [name], NP       amLODipine (NORVASC) tablet 10 mg  10 mg Oral Daily Sarina IllHerrick, Simra Fiebig Edward, DO   10 mg at 03/29/22 0937   divalproex (DEPAKOTE) DR tablet 500 mg  500 mg Oral BH-q8a4p Sarina IllHerrick, Nargis Abrams Edward, DO   500 mg at 03/29/22 0758   feeding supplement (GLUCERNA SHAKE) (GLUCERNA SHAKE) liquid 237 mL  237 mL Oral TID  BM [name], DO   237 mL at 03/29/22 5170   folic acid (FOLVITE) tablet 1 mg  1 mg Oral Daily [name] F, NP   1 mg at 03/29/22 0174   ibuprofen (ADVIL) tablet 400 mg  400 mg Oral Q6H PRN [name], DO       lisinopril (ZESTRIL) tablet 40 mg  40 mg Oral QPC breakfast [name], DO   40 mg at 03/29/22 9449   LORazepam (ATIVAN) tablet 1 mg  1 mg Oral Q4H PRN [name], DO       magnesium hydroxide (MILK OF MAGNESIA) suspension 30 mL  30 mL Oral Daily PRN Vanetta Mulders, NP       multivitamin with minerals tablet 1 tablet  1 tablet Oral Daily [name] F, NP   1 tablet at 03/29/22 0937   OLANZapine (ZYPREXA) tablet 10 mg  10 mg Oral Q6H PRN [name], DO       QUEtiapine (SEROQUEL) tablet 100 mg  100 mg Oral QHS  [name], DO   100 mg at 03/28/22 2201   temazepam (RESTORIL) capsule 30 mg  30 mg Oral QHS PRN [name], DO   30 mg at 03/28/22 2201   thiamine tablet 100 mg  100 mg Oral Daily [name] F, NP   100 mg at 03/29/22 6759   Or   thiamine (B-1) injection 100 mg  100 mg Intravenous Daily [name] F, NP        Lab Results: No results found for this or any previous visit (from the past 48 hour(s)).  Blood Alcohol level:  Lab Results  Component Value Date   ETH <10 03/25/2022    Metabolic Disorder Labs: No results found for: HGBA1C, MPG No results found for: PROLACTIN No results found for: CHOL, TRIG, HDL, CHOLHDL, VLDL, LDLCALC  Physical Findings: AIMS:  , ,  ,  ,    CIWA:  CIWA-Ar Total: 3 COWS:     Musculoskeletal: Strength & Muscle Tone: within normal limits Gait & Station: normal Patient leans: N/A  Psychiatric Specialty Exam:  Presentation  General Appearance: Bizarre  Eye Contact:Good  Speech:Pressured  Speech Volume:Increased  Handedness:Right   Mood and Affect  Mood:Irritable  Affect:Inappropriate; Full Range   Thought Process  Thought Processes:Disorganized  Descriptions of Associations:Tangential  Orientation:Full (Time, Place and Person)  Thought Content:Illogical; Tangential  History of Schizophrenia/Schizoaffective disorder:No  Duration of Psychotic Symptoms:Greater than six months  Hallucinations:No data recorded Ideas of Reference:None  Suicidal Thoughts:No data recorded Homicidal Thoughts:No data recorded  Sensorium  Memory:Immediate Poor; Recent Fair; Remote Fair  Judgment:Impaired  Insight:Poor   Executive Functions  Concentration:Poor  Attention Span:Poor  Recall:Poor  Fund of Knowledge:Poor  Language:Poor   Psychomotor Activity  Psychomotor Activity:No data recorded  Assets  Assets:Desire for Improvement; Resilience; Social Support   Sleep  Sleep:No data  recorded   Physical Exam: Physical Exam Vitals and nursing note reviewed.  Constitutional:      Appearance: Normal appearance. He is normal weight.  Neurological:     General: No focal deficit present.     Mental Status: He is alert and oriented to person, place, and time.  Psychiatric:        Attention and Perception: Attention and perception normal.        Mood and Affect: Mood is elated.        Speech: Speech is rapid and pressured.  Behavior: Behavior is agitated.        Thought Content: Thought content normal.        Cognition and Memory: Cognition and memory normal.        Judgment: Judgment is impulsive.   Review of Systems  Constitutional: Negative.   HENT: Negative.    Eyes: Negative.   Respiratory: Negative.    Cardiovascular: Negative.   Gastrointestinal: Negative.   Genitourinary: Negative.   Musculoskeletal: Negative.   Skin: Negative.   Neurological: Negative.   Endo/Heme/Allergies: Negative.   Psychiatric/Behavioral: Negative.    Blood pressure (!) 165/94, pulse (!) 101, temperature 98.2 F (36.8 C), resp. rate 20, height 6' (1.829 m), weight 107 kg, SpO2 98 %. Body mass index is 32.01 kg/m.   Treatment Plan Summary: Daily contact with patient to assess and evaluate symptoms and progress in treatment, Medication management, and Plan restart blood pressure medications and start metformin 500 mg twice a day along with his Protonix and Crestor.  Also, restarted his lisinopril and Norvasc and ordered a hemoglobin A1c and lipid panel for tomorrow morning.  Brenson Hartman Tresea Mall, DO 03/29/2022, 10:25 AM

## 2022-03-29 NOTE — Plan of Care (Signed)
Patient presents Labile and Manic.  Patient denies SI/HI/Avh and contracts for safety.  Pt verbalized "My Wife IVCd me because she wants to spend my money".  Patient continues to verbalize inappropriate statements "Me and my brothers love to eat Puss_!.  You look good from the back".  Pt redirected by nursing staff.  Pt did comply with scheduled meds.  Will continue 15 minute safety rounding.  Will continue plan of care.      Problem: Education: Goal: Knowledge of General Education information will improve Description: Including pain rating scale, medication(s)/side effects and non-pharmacologic comfort measures 03/29/2022 0348 by Glynn Octave, RN Outcome: Progressing   Problem: Health Behavior/Discharge Planning: Goal: Ability to manage health-related needs will improve Problem: Activity: Goal: Risk for activity intolerance will decrease Outcome: Progressing   Problem: Nutrition: Goal: Adequate nutrition will be maintained Outcome: Progressing   03/29/2022 0348 by Glynn Octave, RN Outcome: Progressing

## 2022-03-29 NOTE — [facility] Suicide Risk Assessment (Signed)
[facility] INPATIENT:  Family/Significant Other Suicide Prevention Education  Suicide Prevention Education:  Education Completed; Janett Labella, daughter (name of family member/significant other) has been identified by the patient as the family member/significant other with whom the patient will be residing, and identified as the person(s) who will aid the patient in the event of a mental health crisis (suicidal ideations/suicide attempt).  With written consent from the patient, the family member/significant other has been provided the following suicide prevention education, prior to the and/or following the discharge of the patient.  The suicide prevention education provided includes the following: Suicide risk factors Suicide prevention and interventions National Suicide Hotline telephone number [facility] assessment telephone number [facility] Emergency Assistance [place] and/or Residential Mobile Crisis Unit telephone number  Request made of family/significant other to: Remove weapons (e.g., guns, rifles, knives), all items previously/currently identified as safety concern.   Remove drugs/medications (over-the-counter, prescriptions, illicit drugs), all items previously/currently identified as a safety concern.  The family member/significant other verbalizes understanding of the suicide prevention education information provided.  The family member/significant other agrees to remove the items of safety concern listed above.  Kodi Guerrera A [place] 03/29/2022, 2:19 PM

## 2022-03-29 NOTE — Plan of Care (Signed)
Patient presents restless, tangential with rapid pressured speech, blaming wife for hospitalization. Pt denies SI/HI/Avh and contracts for safety. Denies Anxiety and Depression. Assistive device in place. Intrusive with peers,redirection given by nursing staff.  Support and encouragement given. Pt  participates in therapeutic milieu, ate hs snack and did comply with scheduled medications.  Q 15 minute safety rounds continue.  Will continue plan of care.    Problem: Education: Goal: Knowledge of General Education information will improve Description: Including pain rating scale, medication(s)/side effects and non-pharmacologic comfort measures Outcome: Progressing   Problem: Health Behavior/Discharge Planning: Goal: Ability to manage health-related needs will improve Outcome: Progressing   Problem: Clinical Measurements: Goal: Ability to maintain clinical measurements within normal limits will improve Outcome: Progressing Goal: Will remain free from infection Outcome: Progressing

## 2022-03-30 DIAGNOSIS — F319 Bipolar disorder, unspecified: Secondary | ICD-10-CM | POA: Diagnosis not present

## 2022-03-30 LAB — HEMOGLOBIN A1C
Hgb A1c MFr Bld: 6.7 % — ABNORMAL HIGH (ref 4.8–5.6)
Mean Plasma Glucose: 145.59 mg/dL

## 2022-03-30 LAB — LIPID PANEL
Cholesterol: 143 mg/dL (ref 0–200)
HDL: 42 mg/dL (ref >40)
LDL Cholesterol: 62 mg/dL (ref 0–99)
Total CHOL/HDL Ratio: 3.4 RATIO
Triglycerides: 197 mg/dL — ABNORMAL HIGH (ref <150)
VLDL: 39 mg/dL (ref 0–40)

## 2022-03-30 NOTE — Plan of Care (Signed)
Pt labile with pressured speech.  Denies SI/HI/Avh and contracts for safety.  Pt denies Anxiety /Depression.  Pt did comply with scheduled medications.  Continues to be intrusive on unit with peers and nursing staff. Redirection, encouragement and support given.  Night snack and adequate fluids given.Q 15 minute safety checks in place.  Will continue plan of care.     Problem: Education: Goal: Knowledge of General Education information will improve Description: Including pain rating scale, medication(s)/side effects and non-pharmacologic comfort measures Outcome: Progressing   Problem: Health Behavior/Discharge Planning: Goal: Ability to manage health-related needs will improve Outcome: Progressing   Problem: Clinical Measurements: Goal: Ability to maintain clinical measurements within normal limits will improve Outcome: Progressing Goal: Will remain free from infection Outcome: Progressing

## 2022-03-30 NOTE — Plan of Care (Signed)
Patient remain alert, restless, tangential with rapid and pressure speech. Denies SI, HI, and AVH. Denies anxiety and depression. Ate breakfast in the day room among peers with good appetite. Compliant with all due medications. Patient assisted with incontinent care. Shower and clean scrub provided. Patient is currently in his room resting in no apparent distress. Q 15 minute safety check in progress.  Problem: Education: Goal: Knowledge of General Education information will improve Description: Including pain rating scale, medication(s)/side effects and non-pharmacologic comfort measures Outcome: Progressing   Problem: Health Behavior/Discharge Planning: Goal: Ability to manage health-related needs will improve Outcome: Progressing   Problem: Clinical Measurements: Goal: Ability to maintain clinical measurements within normal limits will improve Outcome: Progressing Goal: Will remain free from infection Outcome: Progressing Goal: Diagnostic test results will improve Outcome: Progressing Goal: Respiratory complications will improve Outcome: Progressing Goal: Cardiovascular complication will be avoided Outcome: Progressing   Problem: Activity: Goal: Risk for activity intolerance will decrease Outcome: Progressing   Problem: Nutrition: Goal: Adequate nutrition will be maintained Outcome: Progressing   Problem: Coping: Goal: Level of anxiety will decrease Outcome: Progressing   Problem: Elimination: Goal: Will not experience complications related to bowel motility Outcome: Progressing Goal: Will not experience complications related to urinary retention Outcome: Progressing   Problem: Pain Managment: Goal: General experience of comfort will improve Outcome: Progressing   Problem: Safety: Goal: Ability to remain free from injury will improve Outcome: Progressing   Problem: Skin Integrity: Goal: Risk for impaired skin integrity will decrease Outcome: Progressing

## 2022-03-30 NOTE — Progress Notes (Signed)
[facility] MD Progress Note  03/30/2022 3:18 PM Jorge Barrett  MRN:  [id number] Subjective:  Pt seen and chart reviewed.  Jorge Barrett said that Jorge Barrett is doing well, stated "I don't know why I am here!"  Jorge Barrett believes that Delesia Barrett wanted to change his cell phone, which led to his wife putting him in the hospital. Jorge Barrett fixated on having an appointment, either with his doctor, or visiting his brother in prison. Jorge Barrett continues to have pressured speech and racing thoughts.   Principal Problem: Bipolar 1 disorder (HCC) Diagnosis: Principal Problem:   Bipolar 1 disorder (HCC)  Total Time spent with patient: 30 minutes  Past Psychiatric History: none  Past Medical History:  Past Medical History:  Diagnosis Date   Diabetes mellitus without complication (HCC)    High cholesterol    Hypertension    Hypothyroidism    History reviewed. No pertinent surgical history. Family History: History reviewed. No pertinent family history. Family Psychiatric  History: No Social History:  Social History   Substance and Sexual Activity  Alcohol Use Yes   Alcohol/week: 2.0 standard drinks   Types: 2 Shots of liquor per week   Comment: 1-2 shots at least twice a week     Social History   Substance and Sexual Activity  Drug Use Not Currently    Social History   Socioeconomic History   Marital status: Married    Spouse name: Not on file   Number of children: 2   Years of education: 9   Highest education level: 9th grade  Occupational History   Not on file  Tobacco Use   Smoking status: Former    Types: Cigarettes   Smokeless tobacco: Former    Types: Chew   Tobacco comments:    Stopped smoking and chewing tobacco years ago  Building services engineer Use: Never used  Substance and Sexual Activity   Alcohol use: Yes    Alcohol/week: 2.0 standard drinks    Types: 2 Shots of liquor per week    Comment: 1-2 shots at least twice a week   Drug use: Not Currently   Sexual activity: Not on file  Other Topics Concern    Not on file  Social History Narrative   Married, lives with wife, 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   Social Determinants of Health   Financial Resource Strain: Not on file  Food Insecurity: Not on file  Transportation Needs: Not on file  Physical Activity: Not on file  Stress: Not on file  Social Connections: Not on file   Additional Social History:    Sleep: Good  Appetite:  Good  Current Medications: Current Facility-Administered Medications  Medication Dose Route Frequency Provider Last Rate Last Admin   alum & mag hydroxide-simeth (MAALOX/MYLANTA) 200-200-20 MG/5ML suspension 30 mL  30 mL Oral Q4H PRN [name] F, NP       amLODipine (NORVASC) tablet 10 mg  10 mg Oral Daily [name], DO   10 mg at 03/30/22 1042   divalproex (DEPAKOTE) DR tablet 500 mg  500 mg Oral BH-q8a4p [name], DO   500 mg at 03/30/22 0854   feeding supplement (GLUCERNA SHAKE) (GLUCERNA SHAKE) liquid 237 mL  237 mL Oral TID BM [name], DO   237 mL at 03/30/22 1435   folic acid (FOLVITE) tablet 1 mg  1 mg Oral Daily [name] F, NP   1 mg at 03/30/22 1042   ibuprofen (ADVIL)  tablet 400 mg  400 mg Oral Q6H PRN Sarina IllHerrick, [name], DO   400 mg at 03/29/22 1040   lisinopril (ZESTRIL) tablet 40 mg  40 mg Oral QPC breakfast Sarina IllHerrick, [name], DO   40 mg at 03/29/22 [id number]   LORazepam (ATIVAN) tablet 1 mg  1 mg Oral Q4H PRN Sarina IllHerrick, [name], DO       magnesium hydroxide (MILK OF MAGNESIA) suspension 30 mL  30 mL Oral Daily PRN [name], [name], NP       metFORMIN (GLUCOPHAGE-XR) 24 hr tablet 500 mg  500 mg Oral BID WC Sarina IllHerrick, [name], DO   500 mg at 03/30/22 0900   multivitamin with minerals tablet 1 tablet  1 tablet Oral Daily Gabriel CirriBarthold, [name], NP   1 tablet at 03/30/22 1042   OLANZapine (ZYPREXA) tablet 10 mg  10 mg Oral Q6H PRN Sarina IllHerrick, [name], DO       pantoprazole (PROTONIX) EC tablet 40 mg  40  mg Oral Daily Sarina IllHerrick, [name], DO   40 mg at 03/30/22 1042   QUEtiapine (SEROQUEL) tablet 100 mg  100 mg Oral QHS Sarina IllHerrick, [name], DO   100 mg at 03/29/22 2149   rosuvastatin (CRESTOR) tablet 10 mg  10 mg Oral Daily Sarina IllHerrick, [name], DO   10 mg at 03/30/22 1042   temazepam (RESTORIL) capsule 30 mg  30 mg Oral QHS PRN Sarina IllHerrick, [name], DO   30 mg at 03/29/22 2149   thiamine tablet 100 mg  100 mg Oral Daily Gabriel CirriBarthold, [name], NP   100 mg at 03/30/22 1042   Or   thiamine (B-1) injection 100 mg  100 mg Intravenous Daily [name], [name], NP        Lab Results:  Results for orders placed or performed during the hospital encounter of 03/26/22 (from the past 48 hour(s))  Hemoglobin A1c     Status: Abnormal   Collection Time: 03/30/22  6:01 AM  Result Value Ref Range   Hgb A1c MFr Bld 6.7 (H) 4.8 - 5.6 %    Comment: (NOTE) Pre diabetes:          5.7%-6.4%  Diabetes:              >6.4%  Glycemic control for   <7.0% adults with diabetes    Mean Plasma Glucose 145.59 mg/dL    Comment: Performed at [facility], [address] [place], [place] [address]  Lipid panel     Status: Abnormal   Collection Time: 03/30/22  6:01 AM  Result Value Ref Range   Cholesterol 143 0 - 200 mg/dL   Triglycerides 409197 (H) <150 mg/dL   HDL 42 >81>40 mg/dL   Total CHOL/HDL Ratio 3.4 RATIO   VLDL 39 0 - 40 mg/dL   LDL Cholesterol 62 0 - 99 mg/dL    Comment:        Total Cholesterol/HDL:CHD Risk Coronary Heart Disease Risk Table                     Men   Women  1/2 Average Risk   3.4   3.3  Average Risk       5.0   4.4  2 X Average Risk   9.6   7.1  3 X Average Risk  23.4   11.0        Use the calculated Patient Ratio above and the CHD Risk Table to determine the  patient's CHD Risk.        ATP III CLASSIFICATION (LDL):  <100     mg/dL   Optimal  086-578  mg/dL   Near or Above                    Optimal  130-159  mg/dL   Borderline  469-629  mg/dL   High  >528      mg/dL   Very High Performed at [facility], [address] [place], [place] [address]     Blood Alcohol level:  Lab Results  Component Value Date   [facility] <10 03/25/2022    Metabolic Disorder Labs: Lab Results  Component Value Date   HGBA1C 6.7 (H) 03/30/2022   MPG 145.59 03/30/2022   No results found for: PROLACTIN Lab Results  Component Value Date   CHOL 143 03/30/2022   TRIG 197 (H) 03/30/2022   HDL 42 03/30/2022   CHOLHDL 3.4 03/30/2022   VLDL 39 03/30/2022   LDLCALC 62 03/30/2022    Physical Findings: AIMS:  , ,  ,  ,    CIWA:  CIWA-Ar Total: 3 COWS:     Musculoskeletal: Strength & Muscle Tone: within normal limits Gait & Station: normal Patient leans: N/A  Psychiatric Specialty Exam:  Presentation  General Appearance: Bizarre  Eye Contact:Good  Speech:Pressured  Speech Volume:Increased  Handedness:Right   Mood and Affect  Mood:Irritable  Affect:Inappropriate; Full Range   Thought Process  Thought Processes:Disorganized  Descriptions of Associations:Tangential  Orientation:Full (Time, Place and Person)  Thought Content:Illogical; Tangential  History of Schizophrenia/Schizoaffective disorder:No  Duration of Psychotic Symptoms:Greater than six months  Hallucinations:No data recorded Ideas of Reference:None  Suicidal Thoughts:No data recorded Homicidal Thoughts:No data recorded  Sensorium  Memory:Immediate Poor; Recent Fair; Remote Fair  Judgment:Impaired  Insight:Poor   Executive Functions  Concentration:Poor  Attention Span:Poor  Recall:Poor  Fund of Knowledge:Poor  Language:Poor   Psychomotor Activity  Psychomotor Activity:No data recorded  Assets  Assets:Desire for Improvement; Resilience; Social Support   Sleep  Sleep:No data recorded   Physical Exam: Physical Exam Vitals and nursing note reviewed.  Constitutional:      Appearance: Normal appearance. Kasson Lamere is normal weight.  Neurological:      General: No focal deficit present.     Mental Status: Derick Seminara is alert and oriented to person, place, and time.  Psychiatric:        Attention and Perception: Attention and perception normal.        Mood and Affect: Mood is elated.        Speech: Speech is rapid and pressured.        Behavior: Behavior is agitated.        Thought Content: Thought content normal.        Cognition and Memory: Cognition and memory normal.        Judgment: Judgment is impulsive.   Review of Systems  Constitutional: Negative.   HENT: Negative.    Eyes: Negative.   Respiratory: Negative.    Cardiovascular: Negative.   Gastrointestinal: Negative.   Genitourinary: Negative.   Musculoskeletal: Negative.   Skin: Negative.   Neurological: Negative.   Endo/Heme/Allergies: Negative.   Psychiatric/Behavioral: Negative.    Blood pressure 131/68, pulse 91, temperature 98 F (36.7 C), temperature source Oral, resp. rate 18, height 6' (1.829 m), weight 107 kg, SpO2 99 %. Body mass index is 32.01 kg/m.   Treatment Plan Summary: Daily contact with patient to assess and evaluate symptoms  and progress in treatment and Medication management  Bipolar I -- continue Depakote 500mg  po BID -- cointunue seroquel 100mg  qhs.  -- continue Temazepam 30mg  qhs Prn, (used nightly for the past 2 nights)  DM-II, HLD, and HTN -- continue Metformin 500 mg BID -- continue Crestor 10mg  daily.  -- continue amlodipine 10mg  daily.    Md Smola, MD 03/30/2022, 3:18 PM

## 2022-03-31 DIAGNOSIS — F319 Bipolar disorder, unspecified: Secondary | ICD-10-CM | POA: Diagnosis not present

## 2022-03-31 NOTE — Progress Notes (Addendum)
Patient has been active in the milieu. Continues to experience manic episodes: talkative, intrusive and preoccupied. He is otherwise pleasant and cooperative. Safety maintained per 1:1 precautions.

## 2022-03-31 NOTE — Progress Notes (Signed)
Nurse Tech noticed patient lying on the floor beside bed during 15-minute rounding. Nurse Tech notified this Clinical research associate.  Pt informed this Clinical research associate that "I was trying to use the bathroom, and some came out and then I ended up on the floor. I then finished urinating and I don't know how I got down here I was adjusting myself in the bed and then I was on the floor". Assistive device in place and nonskid footwear in use.  Pt bed linen on the floor in a pool of urine.  Pt scrubs soaked in urine. Pt denies any injury to head.  Pt denies any pain, physical assessment unremarkable and vital signs wnl. Pt assisted to bathroom to change clothes and shower. Clean scrubs and non-slip footwear given. Full bed linen change. Pt refused incontinence pad and bedside commode available at bedside. This Clinical research associate encouraged pt to utilize call bell when up to bathroom. BHH AC, AC/H Sup, Back UP Call Provider and Daughter all notified. 1:1 safety observation ordered and in place. Pt in no apparent distress.  Q 15-minute safety checks will continue.

## 2022-03-31 NOTE — Progress Notes (Signed)
[facility] MD Progress Note  03/31/2022 12:45 PM Jorge Barrett  MRN:  [id number] Subjective:  Pt seen and chart reviewed.  Jorge Barrett said that Jorge Barrett is doing ok, but worried about his weekly ozempic inject.  Jorge Barrett is assured that we are monitoring his diabetes.  Otherwise, doing better, less pressured speech.   No SI, or HI.   Principal Problem: Bipolar 1 disorder ([place]) Diagnosis: Principal Problem:   Bipolar 1 disorder ([place])  Total Time spent with patient: 30 minutes  Past Psychiatric History: none  Past Medical History:  Past Medical History:  Diagnosis Date   Diabetes mellitus without complication ([place])    High cholesterol    Hypertension    Hypothyroidism    History reviewed. No pertinent surgical history. Family History: History reviewed. No pertinent family history. Family Psychiatric  History: No Social History:  Social History   Substance and Sexual Activity  Alcohol Use Yes   Alcohol/week: 2.0 standard drinks   Types: 2 Shots of liquor per week   Comment: 1-2 shots at least twice a week     Social History   Substance and Sexual Activity  Drug Use Not Currently    Social History   Socioeconomic History   Marital status: Married    Spouse name: Not on file   Number of children: 2   Years of education: 9   Highest education level: 9th grade  Occupational History   Not on file  Tobacco Use   Smoking status: Former    Types: Cigarettes   Smokeless tobacco: Former    Types: Chew   Tobacco comments:    Stopped smoking and chewing tobacco years ago  Scientific laboratory technician Use: Never used  Substance and Sexual Activity   Alcohol use: Yes    Alcohol/week: 2.0 standard drinks    Types: 2 Shots of liquor per week    Comment: 1-2 shots at least twice a week   Drug use: Not Currently   Sexual activity: Not on file  Other Topics Concern   Not on file  Social History Narrative   Married, lives with wife, has 2 adult children, owns business "Patent examiner".   Social  Determinants of Health   Financial Resource Strain: Not on file  Food Insecurity: Not on file  Transportation Needs: Not on file  Physical Activity: Not on file  Stress: Not on file  Social Connections: Not on file   Additional Social History:    Sleep: Good  Appetite:  Good  Current Medications: Current Facility-Administered Medications  Medication Dose Route Frequency Provider Last Rate Last Admin   alum & mag hydroxide-simeth (MAALOX/MYLANTA) 200-200-20 MG/5ML suspension 30 mL  30 mL Oral Q4H PRN Waldon Merl F, NP       amLODipine (NORVASC) tablet 10 mg  10 mg Oral Daily Parks Ranger, DO   10 mg at 03/31/22 0940   divalproex (DEPAKOTE) DR tablet 500 mg  500 mg Oral BH-q8a4p Parks Ranger, DO   500 mg at 03/31/22 0819   feeding supplement (GLUCERNA SHAKE) (GLUCERNA SHAKE) liquid 237 mL  237 mL Oral TID BM Parks Ranger, DO   237 mL at [id number]   folic acid (FOLVITE) tablet 1 mg  1 mg Oral Daily Waldon Merl F, NP   1 mg at 03/31/22 0940   ibuprofen (ADVIL) tablet 400 mg  400 mg Oral Q6H PRN Parks Ranger, DO   400 mg at 03/30/22 1637   lisinopril (ZESTRIL)  tablet 40 mg  40 mg Oral QPC breakfast Parks Ranger, DO   40 mg at 03/31/22 [id number]   LORazepam (ATIVAN) tablet 1 mg  1 mg Oral Q4H PRN Parks Ranger, DO       magnesium hydroxide (MILK OF MAGNESIA) suspension 30 mL  30 mL Oral Daily PRN Sherlon Handing, NP       metFORMIN (GLUCOPHAGE-XR) 24 hr tablet 500 mg  500 mg Oral BID WC Parks Ranger, DO   500 mg at 03/31/22 1012   multivitamin with minerals tablet 1 tablet  1 tablet Oral Daily Sherlon Handing, NP   1 tablet at 03/31/22 0940   OLANZapine (ZYPREXA) tablet 10 mg  10 mg Oral Q6H PRN Parks Ranger, DO       pantoprazole (PROTONIX) EC tablet 40 mg  40 mg Oral Daily Parks Ranger, DO   40 mg at 03/31/22 0940   QUEtiapine (SEROQUEL) tablet 100 mg  100 mg Oral QHS Parks Ranger, DO   100 mg at 03/30/22 2102   rosuvastatin (CRESTOR) tablet 10 mg  10 mg Oral Daily Parks Ranger, DO   10 mg at 03/31/22 0940   temazepam (RESTORIL) capsule 30 mg  30 mg Oral QHS PRN Parks Ranger, DO   30 mg at 03/30/22 2102   thiamine tablet 100 mg  100 mg Oral Daily Sherlon Handing, NP   100 mg at 03/31/22 [id number]   Or   thiamine (B-1) injection 100 mg  100 mg Intravenous Daily Sherlon Handing, NP        Lab Results:  Results for orders placed or performed during the hospital encounter of 03/26/22 (from the past 48 hour(s))  Hemoglobin A1c     Status: Abnormal   Collection Time: 03/30/22  6:01 AM  Result Value Ref Range   Hgb A1c MFr Bld 6.7 (H) 4.8 - 5.6 %    Comment: (NOTE) Pre diabetes:          5.7%-6.4%  Diabetes:              >6.4%  Glycemic control for   <7.0% adults with diabetes    Mean Plasma Glucose 145.59 mg/dL    Comment: Performed at [facility], [place] [address] [place], [place] [address]  Lipid panel     Status: Abnormal   Collection Time: 03/30/22  6:01 AM  Result Value Ref Range   Cholesterol 143 0 - 200 mg/dL   Triglycerides 197 (H) <150 mg/dL   HDL 42 >40 mg/dL   Total CHOL/HDL Ratio 3.4 RATIO   VLDL 39 0 - 40 mg/dL   LDL Cholesterol 62 0 - 99 mg/dL    Comment:        Total Cholesterol/HDL:CHD Risk Coronary Heart Disease Risk Table                     Men   Women  1/2 Average Risk   3.4   3.3  Average Risk       5.0   4.4  2 X Average Risk   9.6   7.1  3 X Average Risk  23.4   11.0        Use the calculated Patient Ratio above and the CHD Risk Table to determine the patient's CHD Risk.        ATP III CLASSIFICATION (LDL):  <100     mg/dL   Optimal  100-129  mg/dL   Near or Above                    Optimal  130-159  mg/dL   Borderline  160-189  mg/dL   High  >190     mg/dL   Very High Performed at [facility], [address] [place], [place] [address]     Blood Alcohol level:   Lab Results  Component Value Date   [facility] <10 0000000    Metabolic Disorder Labs: Lab Results  Component Value Date   HGBA1C 6.7 (H) 03/30/2022   MPG 145.59 03/30/2022   No results found for: PROLACTIN Lab Results  Component Value Date   CHOL 143 03/30/2022   TRIG 197 (H) 03/30/2022   HDL 42 03/30/2022   CHOLHDL 3.4 03/30/2022   VLDL 39 03/30/2022   LDLCALC 62 03/30/2022    Physical Findings: AIMS:  , ,  ,  ,    CIWA:  CIWA-Ar Total: 3 COWS:     Musculoskeletal: Strength & Muscle Tone: within normal limits Gait & Station: normal Patient leans: N/A  Psychiatric Specialty Exam:  Presentation  General Appearance: Bizarre  Eye Contact:Good  Speech:Pressured  Speech Volume:Increased  Handedness:Right   Mood and Affect  Mood:Irritable  Affect:Inappropriate; Full Range   Thought Process  Thought Processes:Disorganized  Descriptions of Associations:Tangential  Orientation:Full (Time, Place and Person)  Thought Content:Illogical; Tangential  History of Schizophrenia/Schizoaffective disorder:No  Duration of Psychotic Symptoms:Greater than six months  Hallucinations:No data recorded Ideas of Reference:None  Suicidal Thoughts:No data recorded Homicidal Thoughts:No data recorded  Sensorium  Memory:Immediate Poor; Recent Fair; Remote Fair  Judgment:Impaired  Insight:Poor   Executive Functions  Concentration:Poor  Attention Span:Poor  Recall:Poor  Fund of Knowledge:Poor  Language:Poor   Psychomotor Activity  Psychomotor Activity:No data recorded  Assets  Assets:Desire for Improvement; Resilience; Social Support   Sleep  Sleep:No data recorded   Physical Exam: Physical Exam Vitals and nursing note reviewed.  Constitutional:      Appearance: Normal appearance. Jorge Barrett is normal weight.  Neurological:     General: No focal deficit present.     Mental Status: Jorge Barrett is alert and oriented to person, place, and time.  Psychiatric:         Attention and Perception: Attention and perception normal.        Mood and Affect: Mood is elated.        Speech: Speech is rapid and pressured.        Behavior: Behavior is agitated.        Thought Content: Thought content normal.        Cognition and Memory: Cognition and memory normal.        Judgment: Judgment is impulsive.   Review of Systems  Constitutional: Negative.   HENT: Negative.    Eyes: Negative.   Respiratory: Negative.    Cardiovascular: Negative.   Gastrointestinal: Negative.   Genitourinary: Negative.   Musculoskeletal: Negative.   Skin: Negative.   Neurological: Negative.   Endo/Heme/Allergies: Negative.   Psychiatric/Behavioral: Negative.    Blood pressure 132/83, pulse 88, temperature 97.6 F (36.4 C), temperature source Oral, resp. rate 18, height 6' (1.829 m), weight 107 kg, SpO2 97 %. Body mass index is 32.01 kg/m.   Treatment Plan Summary: Daily contact with patient to assess and evaluate symptoms and progress in treatment and Medication management  Bipolar I -- continue Depakote 500mg  po BID -- cointunue seroquel 100mg  qhs.  -- continue Temazepam  30mg  qhs Prn, (used nightly for the past 2 nights)  DM-II, HLD, and HTN -- continue Metformin 500 mg BID -- continue Crestor 10mg  daily.  -- continue amlodipine 10mg  daily.    Marycarmen Hagey, MD 03/31/2022, 12:45 PM

## 2022-03-31 NOTE — Plan of Care (Signed)
Patient is out of bed. Currently eating breakfast. Pleasant on approach but irritable at times. Preoccupied and blaming his wife "she is my only problem". Patient is oriented and taking medications as scheduled. Appetite is good. Had one episode of urine incontinence: was assisted with hygiene and bed was fully changed. Remains on 1:1 for safety precautions.

## 2022-03-31 NOTE — Group Note (Signed)
LCSW Group Therapy Note  Group Date: 03/31/2022 Start Time: 1400 End Time: 1500   Type of Therapy and Topic:  Group Therapy - Healthy vs Unhealthy Coping Skills  Participation Level:  Active   Description of Group The focus of this group was to determine what unhealthy coping techniques typically are used by group members and what healthy coping techniques would be helpful in coping with various problems. Patients were guided in becoming aware of the differences between healthy and unhealthy coping techniques. Patients were asked to identify 2-3 healthy coping skills they would like to learn to use more effectively.  Therapeutic Goals Patients learned that coping is what human beings do all day long to deal with various situations in their lives Patients defined and discussed healthy vs unhealthy coping techniques Patients identified their preferred coping techniques and identified whether these were healthy or unhealthy Patients determined 2-3 healthy coping skills they would like to become more familiar with and use more often. Patients provided support and ideas to each other   Summary of Patient Progress: Patient was present for the duration of group. Patient contributed to group discussion; however, patient dominated the group and the majority of patient's contributions were off topic. Patient required frequent redirection. Patient shared that he would like his family members to leave him alone, stating "If I need you, i'll call. Leave it alone." Patient identified spending time with his grandchildren as an activity he would like to engage in more often. Patient appeared focused on his daughter owning "everything I got."   Therapeutic Modalities Cognitive Behavioral Therapy Motivational Interviewing  Norberto Sorenson, Theresia Majors 03/31/2022  4:27 PM

## 2022-04-01 DIAGNOSIS — F319 Bipolar disorder, unspecified: Secondary | ICD-10-CM | POA: Diagnosis not present

## 2022-04-01 NOTE — Progress Notes (Signed)
Pt sleepy, yet polite when getting vital signs, falling asleep while eating, assist pt with probing to eat and stay awake.

## 2022-04-01 NOTE — Plan of Care (Signed)

## 2022-04-01 NOTE — Progress Notes (Signed)
Pt has been cooperative and med compliant. Pt has tangential, pressured speech today, slow and shuffled with a walker. He has been in the dayroom all day and participated in groups and going outside. He has two PRNs today for pain. Many times he was getting elevated because he said that he was ready to be discharged.Torrie Mayers RN

## 2022-04-01 NOTE — BH IP Treatment Plan (Unsigned)
Interdisciplinary Treatment and Diagnostic Plan Update  04/01/2022 Time of Session: 9:30AM Jorge Barrett MRN: [id number]  Principal Diagnosis: Bipolar 1 disorder [facility])  Secondary Diagnoses: Principal Problem:   Bipolar 1 disorder (HCC)   Current Medications:  Current Facility-Administered Medications  Medication Dose Route Frequency Provider Last Rate Last Admin   alum & mag hydroxide-simeth (MAALOX/MYLANTA) 200-200-20 MG/5ML suspension 30 mL  30 mL Oral Q4H PRN Vanetta Mulders, NP       amLODipine (NORVASC) tablet 10 mg  10 mg Oral Daily [name], DO   10 mg at 03/31/22 0940   divalproex (DEPAKOTE) DR tablet 500 mg  500 mg Oral BH-q8a4p [name], DO   500 mg at 04/01/22 1004   feeding supplement (GLUCERNA SHAKE) (GLUCERNA SHAKE) liquid 237 mL  237 mL Oral TID BM [name], DO   237 mL at 04/01/22 1011   folic acid (FOLVITE) tablet 1 mg  1 mg Oral Daily [name] F, NP   1 mg at 04/01/22 1003   ibuprofen (ADVIL) tablet 400 mg  400 mg Oral Q6H PRN [name], DO   400 mg at 04/01/22 1104   lisinopril (ZESTRIL) tablet 40 mg  40 mg Oral QPC breakfast [name], DO   40 mg at 04/01/22 1003   LORazepam (ATIVAN) tablet 1 mg  1 mg Oral Q4H PRN [name], DO       magnesium hydroxide (MILK OF MAGNESIA) suspension 30 mL  30 mL Oral Daily PRN [name] F, NP       metFORMIN (GLUCOPHAGE-XR) 24 hr tablet 500 mg  500 mg Oral BID WC [name], DO   500 mg at 04/01/22 1004   multivitamin with minerals tablet 1 tablet  1 tablet Oral Daily [name] F, NP   1 tablet at 04/01/22 1002   OLANZapine (ZYPREXA) tablet 10 mg  10 mg Oral Q6H PRN [name], DO       pantoprazole (PROTONIX) EC tablet 40 mg  40 mg Oral Daily [name], DO   40 mg at 04/01/22 1003   QUEtiapine (SEROQUEL) tablet 100 mg  100 mg Oral QHS [name], DO   100 mg at  03/31/22 2136   rosuvastatin (CRESTOR) tablet 10 mg  10 mg Oral Daily [name], DO   10 mg at 04/01/22 1003   temazepam (RESTORIL) capsule 30 mg  30 mg Oral QHS PRN [name], DO   30 mg at 03/31/22 2136   thiamine tablet 100 mg  100 mg Oral Daily [name] F, NP   100 mg at 04/01/22 1005   Or   thiamine (B-1) injection 100 mg  100 mg Intravenous Daily [name] F, NP       PTA Medications: Medications Prior to Admission  Medication Sig Dispense Refill Last Dose   allopurinol (ZYLOPRIM) 100 MG tablet Take 100 mg by mouth daily.   03/25/2022   amLODipine (NORVASC) 10 MG tablet Take 10 mg by mouth daily.   03/25/2022   divalproex (DEPAKOTE) 500 MG DR tablet Take 500 mg by mouth 2 (two) times daily.   03/25/2022   levothyroxine (SYNTHROID) 75 MCG tablet Take 75 mcg by mouth daily.   03/25/2022   lisinopril (ZESTRIL) 40 MG tablet Take 40 mg by mouth daily.   03/25/2022   omeprazole (PRILOSEC) 20 MG capsule Take 20 mg by mouth daily.   03/25/2022   rosuvastatin (  CRESTOR) 10 MG tablet Take 10 mg by mouth daily.   03/25/2022   Semaglutide, 2 MG/DOSE, 8 MG/3ML SOPN Inject 2 mg into the skin once a week.   Past Week   tadalafil (CIALIS) 10 MG tablet Take 10 mg by mouth as needed.   Unknown   tamsulosin (FLOMAX) 0.4 MG CAPS capsule Take 0.4 mg by mouth daily.   More than a month    Patient Stressors:    Patient Strengths:    Treatment Modalities: Medication Management, Group therapy, Case management,  1 to 1 session with clinician, Psychoeducation, Recreational therapy.   Physician Treatment Plan for Primary Diagnosis: Bipolar 1 disorder (HCC) Long Term Goal(s): Improvement in symptoms so as ready for discharge   Short Term Goals: Ability to identify changes in lifestyle to reduce recurrence of condition will improve Ability to verbalize feelings will improve Ability to disclose and discuss suicidal ideas Ability to demonstrate self-control will  improve Ability to identify and develop effective coping behaviors will improve Ability to maintain clinical measurements within normal limits will improve Compliance with prescribed medications will improve Ability to identify triggers associated with substance abuse/mental health issues will improve  Medication Management: Evaluate patient's response, side effects, and tolerance of medication regimen.  Therapeutic Interventions: 1 to 1 sessions, Unit Group sessions and Medication administration.  Evaluation of Outcomes: Progressing  Physician Treatment Plan for Secondary Diagnosis: Principal Problem:   Bipolar 1 disorder (HCC)  Long Term Goal(s): Improvement in symptoms so as ready for discharge   Short Term Goals: Ability to identify changes in lifestyle to reduce recurrence of condition will improve Ability to verbalize feelings will improve Ability to disclose and discuss suicidal ideas Ability to demonstrate self-control will improve Ability to identify and develop effective coping behaviors will improve Ability to maintain clinical measurements within normal limits will improve Compliance with prescribed medications will improve Ability to identify triggers associated with substance abuse/mental health issues will improve     Medication Management: Evaluate patient's response, side effects, and tolerance of medication regimen.  Therapeutic Interventions: 1 to 1 sessions, Unit Group sessions and Medication administration.  Evaluation of Outcomes: Progressing   RN Treatment Plan for Primary Diagnosis: Bipolar 1 disorder (HCC) Long Term Goal(s): Knowledge of disease and therapeutic regimen to maintain health will improve  Short Term Goals: Ability to remain free from injury will improve, Ability to verbalize frustration and anger appropriately will improve, Ability to demonstrate self-control, Ability to participate in decision making will improve, Ability to verbalize  feelings will improve, Ability to identify and develop effective coping behaviors will improve, and Compliance with prescribed medications will improve  Medication Management: RN will administer medications as ordered by provider, will assess and evaluate patient's response and provide education to patient for prescribed medication. RN will report any adverse and/or side effects to prescribing provider.  Therapeutic Interventions: 1 on 1 counseling sessions, Psychoeducation, Medication administration, Evaluate responses to treatment, Monitor vital signs and CBGs as ordered, Perform/monitor CIWA, COWS, AIMS and Fall Risk screenings as ordered, Perform wound care treatments as ordered.  Evaluation of Outcomes: Progressing   LCSW Treatment Plan for Primary Diagnosis: Bipolar 1 disorder (HCC) Long Term Goal(s): Safe transition to appropriate next level of care at discharge, Engage patient in therapeutic group addressing interpersonal concerns.  Short Term Goals: Engage patient in aftercare planning with referrals and resources, Increase social support, Increase ability to appropriately verbalize feelings, Increase emotional regulation, Facilitate acceptance of mental health diagnosis and concerns, Identify triggers associated with  mental health/substance abuse issues, and Increase skills for wellness and recovery  Therapeutic Interventions: Assess for all discharge needs, 1 to 1 time with Social worker, Explore available resources and support systems, Assess for adequacy in community support network, Educate family and significant other(s) on suicide prevention, Complete Psychosocial Assessment, Interpersonal group therapy.  Evaluation of Outcomes: Progressing   Progress in Treatment: Attending groups: Yes. Participating in groups: Yes. Taking medication as prescribed: Yes. Toleration medication: Yes. Family/Significant other contact made: Yes, individual(s) contacted:  SPE completed with pt's  daughter Jorge Barrett Patient understands diagnosis: No. Discussing patient identified problems/goals with staff: Yes. Medical problems stabilized or resolved: Yes. Denies suicidal/homicidal ideation: Yes. Issues/concerns per patient self-inventory: No. Other: None  New problem(s) identified: No, Describe:  None  New Short Term/Long Term Goal(s): Patient to work towards elimination of symptoms of psychosis, medication management for mood stabilization; development of comprehensive mental wellness plan. Update 04/01/22: No changes at this time.    Patient Goals:  "not supposed to be here" Update 04/01/22: No changes at this time.    Discharge Plan or Barriers: CSW will assist pt with development of appropriate discharge/aftercare plan. Update 04/01/22: No changes at this time.    Reason for Continuation of Hospitalization: Delusions  Mania Medication stabilization   Estimated Length of Stay: TBD  Last 3 [place] Suicide Severity Risk Score: Flowsheet Row [facility]  C-SSRS RISK CATEGORY No Risk No Risk       Last PHQ 2/9 Scores:     View : No data to display.          Scribe for Treatment Team: Finnian Husted A [place], Theresia MajorsLCSWA 04/01/2022 2:01 PM

## 2022-04-01 NOTE — Plan of Care (Signed)

## 2022-04-01 NOTE — Progress Notes (Signed)
Pt labile with pressured speech.  Denies SI/HI/Avh and contracts for safety.  Pt denies Anxiety /Depression.  Pt did present [place] "I stayed at [employer] and [employer] called me and ask me to come to [place]"! Pt did comply with scheduled medications.  Continues to be intrusive on unit with peers and nursing staff.  Pt needs multiple redirection. Encouragement and emotional support given.  Night snack and adequate fluids given. 1:1 safety attendant at bedside. Q 15 minute safety checks in place.  Will continue plan of care.

## 2022-04-01 NOTE — [facility] Group Notes (Signed)
15:00  Group: outdoor patio  Pt participated in activity: Q ball  Music selection Pt continues to over step boundaries, over talking others. When re-directed Pt has been very good today at following rules.

## 2022-04-01 NOTE — [facility] Group Notes (Signed)
10:30am Group: chair Yoga  Pt attended group- trouble staying awake - did what he could - was pleasant, respectful and happy

## 2022-04-01 NOTE — Progress Notes (Signed)
[facility] [place] MD Progress Note  04/01/2022 12:16 PM Jorge Barrett  MRN:  [id number] Subjective: Jorge Barrett is doing better since starting on Depakote and being compliant.  He is still becomes irritable and intrusive at times but overall is improved.  He denies any side effects from his medications.  He has been pleasant and cooperative with me and his speech is just mildly pressured.  He seems very upset with his wife.  He plans on visiting his brother in prison when he gets out.  In the meantime and to get some blood work tomorrow morning see where his levels are and go from there but I think he can probably be discharged sometime this week.  Principal Problem: Bipolar 1 disorder (HCC) Diagnosis: Principal Problem:   Bipolar 1 disorder (HCC)  Total Time spent with patient: 15 minutes  Past Psychiatric History: He tells me that his wife has committed him twice before and it looks like one time was last month on April 15 but was discharged.  Mania started when he has lost 20 pounds on Ozempic.  I think if he does well on Depakote he could resume Ozempic.  Past Medical History:  Past Medical History:  Diagnosis Date   Diabetes mellitus without complication (HCC)    High cholesterol    Hypertension    Hypothyroidism    History reviewed. No pertinent surgical history. Family History: History reviewed. No pertinent family history. Family Psychiatric  History: Unremarkable Social History:  Social History   Substance and Sexual Activity  Alcohol Use Yes   Alcohol/week: 2.0 standard drinks   Types: 2 Shots of liquor per week   Comment: 1-2 shots at least twice a week     Social History   Substance and Sexual Activity  Drug Use Not Currently    Social History   Socioeconomic History   Marital status: Married    Spouse name: Not on file   Number of children: 2   Years of education: 9   Highest education level: 9th grade  Occupational History   Not on file  Tobacco Use   Smoking status:  Former    Types: Cigarettes   Smokeless tobacco: Former    Types: Chew   Tobacco comments:    Stopped smoking and chewing tobacco years ago  Building services engineer Use: Never used  Substance and Sexual Activity   Alcohol use: Yes    Alcohol/week: 2.0 standard drinks    Types: 2 Shots of liquor per week    Comment: 1-2 shots at least twice a week   Drug use: Not Currently   Sexual activity: Not on file  Other Topics Concern   Not on file  Social History Narrative   Married, lives with wife, 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   Social Determinants of Health   Financial Resource Strain: Not on file  Food Insecurity: Not on file  Transportation Needs: Not on file  Physical Activity: Not on file  Stress: Not on file  Social Connections: Not on file   Additional Social History:                         Sleep: Good  Appetite:  Good  Current Medications: Current Facility-Administered Medications  Medication Dose Route Frequency Provider Last Rate Last Admin   alum & mag hydroxide-simeth (MAALOX/MYLANTA) 200-200-20 MG/5ML suspension 30 mL  30 mL Oral Q4H PRN Vanetta Mulders, NP  amLODipine (NORVASC) tablet 10 mg  10 mg Oral Daily [name], DO   10 mg at 03/31/22 0940   divalproex (DEPAKOTE) DR tablet 500 mg  500 mg Oral BH-q8a4p [name], DO   500 mg at 04/01/22 1004   feeding supplement (GLUCERNA SHAKE) (GLUCERNA SHAKE) liquid 237 mL  237 mL Oral TID BM [name], DO   237 mL at 04/01/22 1011   folic acid (FOLVITE) tablet 1 mg  1 mg Oral Daily [name] F, NP   1 mg at 04/01/22 1003   ibuprofen (ADVIL) tablet 400 mg  400 mg Oral Q6H PRN [name], DO   400 mg at 04/01/22 1104   lisinopril (ZESTRIL) tablet 40 mg  40 mg Oral QPC breakfast [name], DO   40 mg at 04/01/22 1003   LORazepam (ATIVAN) tablet 1 mg  1 mg Oral Q4H PRN [name], DO        magnesium hydroxide (MILK OF MAGNESIA) suspension 30 mL  30 mL Oral Daily PRN Vanetta Mulders, NP       metFORMIN (GLUCOPHAGE-XR) 24 hr tablet 500 mg  500 mg Oral BID WC [name], DO   500 mg at 04/01/22 1004   multivitamin with minerals tablet 1 tablet  1 tablet Oral Daily [name] F, NP   1 tablet at 04/01/22 1002   OLANZapine (ZYPREXA) tablet 10 mg  10 mg Oral Q6H PRN [name], DO       pantoprazole (PROTONIX) EC tablet 40 mg  40 mg Oral Daily [name], DO   40 mg at 04/01/22 1003   QUEtiapine (SEROQUEL) tablet 100 mg  100 mg Oral QHS [name], DO   100 mg at 03/31/22 2136   rosuvastatin (CRESTOR) tablet 10 mg  10 mg Oral Daily [name], DO   10 mg at 04/01/22 1003   temazepam (RESTORIL) capsule 30 mg  30 mg Oral QHS PRN [name], DO   30 mg at 03/31/22 2136   thiamine tablet 100 mg  100 mg Oral Daily [name] F, NP   100 mg at 04/01/22 1005   Or   thiamine (B-1) injection 100 mg  100 mg Intravenous Daily [name] F, NP        Lab Results: No results found for this or any previous visit (from the past 48 hour(s)).  Blood Alcohol level:  Lab Results  Component Value Date   ETH <10 03/25/2022    Metabolic Disorder Labs: Lab Results  Component Value Date   HGBA1C 6.7 (H) 03/30/2022   MPG 145.59 03/30/2022   No results found for: PROLACTIN Lab Results  Component Value Date   CHOL 143 03/30/2022   TRIG 197 (H) 03/30/2022   HDL 42 03/30/2022   CHOLHDL 3.4 03/30/2022   VLDL 39 03/30/2022   LDLCALC 62 03/30/2022    Physical Findings: AIMS:  , ,  ,  ,    CIWA:  CIWA-Ar Total: 3 COWS:     Musculoskeletal: Strength & Muscle Tone: within normal limits Gait & Station: normal Patient leans: N/A  Psychiatric Specialty Exam:  Presentation  General Appearance: Bizarre  Eye Contact:Good  Speech:Pressured  Speech Volume:Increased  Handedness:Right   Mood  and Affect  Mood:Irritable  Affect:Inappropriate; Full Range   Thought Process  Thought Processes:Disorganized  Descriptions of Associations:Tangential  Orientation:Full (Time, Place and Person)  Thought Content:Illogical; Tangential  History of  Schizophrenia/Schizoaffective disorder:No  Duration of Psychotic Symptoms:Greater than six months  Hallucinations:No data recorded Ideas of Reference:None  Suicidal Thoughts:No data recorded Homicidal Thoughts:No data recorded  Sensorium  Memory:Immediate Poor; Recent Fair; Remote Fair  Judgment:Impaired  Insight:Poor   Executive Functions  Concentration:Poor  Attention Span:Poor  Recall:Poor  Fund of Knowledge:Poor  Language:Poor   Psychomotor Activity  Psychomotor Activity:No data recorded  Assets  Assets:Desire for Improvement; Resilience; Social Support   Sleep  Sleep:No data recorded   Physical Exam: Physical Exam Vitals and nursing note reviewed.  Constitutional:      Appearance: Normal appearance. He is normal weight.  Neurological:     General: No focal deficit present.     Mental Status: He is alert and oriented to person, place, and time.  Psychiatric:        Attention and Perception: Attention and perception normal.        Mood and Affect: Mood is elated.        Speech: Speech is rapid and pressured.        Behavior: Behavior is agitated. Behavior is cooperative.        Thought Content: Thought content normal.        Cognition and Memory: Cognition and memory normal.        Judgment: Judgment normal.   Review of Systems  Constitutional: Negative.   HENT: Negative.    Eyes: Negative.   Respiratory: Negative.    Cardiovascular: Negative.   Gastrointestinal: Negative.   Genitourinary: Negative.   Musculoskeletal: Negative.   Skin: Negative.   Neurological: Negative.   Endo/Heme/Allergies: Negative.   Psychiatric/Behavioral: Negative.    Blood pressure (!) 101/53, pulse 73,  temperature (!) 97.4 F (36.3 C), resp. rate 18, height 6' (1.829 m), weight 107 kg, SpO2 100 %. Body mass index is 32.01 kg/m.   Treatment Plan Summary: Daily contact with patient to assess and evaluate symptoms and progress in treatment, Medication management, and Plan Depakote level in the morning along with comprehensive metabolic panel.  Continue current medications.  Sarina Illichard Edward Anaaya Fuster, DO 04/01/2022, 12:16 PM

## 2022-04-02 DIAGNOSIS — F319 Bipolar disorder, unspecified: Secondary | ICD-10-CM | POA: Diagnosis not present

## 2022-04-02 LAB — COMPREHENSIVE METABOLIC PANEL
ALT: 22 U/L (ref 0–44)
AST: 21 U/L (ref 15–41)
Albumin: 3.5 g/dL (ref 3.5–5.0)
Alkaline Phosphatase: 59 U/L (ref 38–126)
Anion gap: 8 (ref 5–15)
BUN: 35 mg/dL — ABNORMAL HIGH (ref 8–23)
CO2: 29 mmol/L (ref 22–32)
Calcium: 8.9 mg/dL (ref 8.9–10.3)
Chloride: 99 mmol/L (ref 98–111)
Creatinine, Ser: 1.01 mg/dL (ref 0.61–1.24)
GFR, Estimated: >60 mL/min (ref >60)
Glucose, Bld: 114 mg/dL — ABNORMAL HIGH (ref 70–99)
Potassium: 4.9 mmol/L (ref 3.5–5.1)
Sodium: 136 mmol/L (ref 135–145)
Total Bilirubin: 0.6 mg/dL (ref 0.3–1.2)
Total Protein: 6.7 g/dL (ref 6.5–8.1)

## 2022-04-02 LAB — VALPROIC ACID LEVEL: Valproic Acid Lvl: 46 ug/mL — ABNORMAL LOW (ref 50.0–100.0)

## 2022-04-02 MED ORDER — DIVALPROEX SODIUM ER 500 MG PO TB24
1000.0000 mg | ORAL_TABLET | Freq: Every day | ORAL | Status: DC
  Administered 2022-04-02: 1000 mg via ORAL
  Filled 2022-04-02: qty 2

## 2022-04-02 MED ORDER — TEMAZEPAM 15 MG PO CAPS
15.0000 mg | ORAL_CAPSULE | Freq: Every evening | ORAL | Status: DC | PRN
  Administered 2022-04-02 – 2022-04-03 (×2): 15 mg via ORAL
  Filled 2022-04-02 (×2): qty 1

## 2022-04-02 NOTE — Progress Notes (Signed)
Recreation Therapy Notes  Date: 04/02/2022   Time: 2:30pm     Location: Court yard     Behavioral response: N/A   Intervention Topic: Strengths     Discussion/Intervention: Patient refused to attend group.    Clinical Observations/Feedback:  Patient refused to attend group.    Majesti Gambrell LRT/CTRS        Karalynn Cottone 04/02/2022 2:35 PM 

## 2022-04-02 NOTE — Plan of Care (Signed)
Patient remain alert with confusion. Calm and cooperative during assessment. Remain on continuous observation. Patient is currently in the day room among staff and peers in no apparent distress. Continue to require constant redirection. Compliant with all due medications. Ate meals in the day room among peers and tolerated well. No complain of pain or discomfort voiced at this time. Will continue to monitor.   Problem: Education: Goal: Knowledge of General Education information will improve Description: Including pain rating scale, medication(s)/side effects and non-pharmacologic comfort measures Outcome: Progressing   Problem: Health Behavior/Discharge Planning: Goal: Ability to manage health-related needs will improve Outcome: Progressing   Problem: Clinical Measurements: Goal: Ability to maintain clinical measurements within normal limits will improve Outcome: Progressing Goal: Will remain free from infection Outcome: Progressing Goal: Diagnostic test results will improve Outcome: Progressing Goal: Respiratory complications will improve Outcome: Progressing Goal: Cardiovascular complication will be avoided Outcome: Progressing   Problem: Activity: Goal: Risk for activity intolerance will decrease Outcome: Progressing   Problem: Nutrition: Goal: Adequate nutrition will be maintained Outcome: Progressing   Problem: Coping: Goal: Level of anxiety will decrease Outcome: Progressing   Problem: Elimination: Goal: Will not experience complications related to bowel motility Outcome: Progressing Goal: Will not experience complications related to urinary retention Outcome: Progressing   Problem: Pain Managment: Goal: General experience of comfort will improve Outcome: Progressing   Problem: Safety: Goal: Ability to remain free from injury will improve Outcome: Progressing   Problem: Skin Integrity: Goal: Risk for impaired skin integrity will decrease Outcome: Progressing

## 2022-04-02 NOTE — Progress Notes (Signed)
Pt awake in dayroom, went to bathroom X1. Cooperative with care. Ambulatory with walker without falls. 1:1 observation maintained as ordered. Assigned staff in attendance at all times. Support and encouragement offered.

## 2022-04-02 NOTE — Progress Notes (Signed)
[facility] MD Progress Note  04/02/2022 11:22 AM Jorge Barrett  MRN:  [id number] Subjective:  Rounds.  He is asking me to go home.  He has not heard from his wife.  I spoke with social work and the family states he is allowed to come home but they do not think he is well enough at this point.  I agree.  I am going to make some med changes today.  His Depakote level was 46 Soma changed from immediate release to long-acting and increase the dose to 1500 all at bedtime.  I explained this to them but he does not want to hear because he is mad at me for not discharging him.  He is sleeping well and eating well.  He wants his Ozempic and I told him he could have a family member bring it in.  He did not like that either.  Principal Problem: Bipolar 1 disorder (HCC) Diagnosis: Principal Problem:   Bipolar 1 disorder (HCC)  Total Time spent with patient: 15 minutes  Past Psychiatric History: He tells me that his wife has committed him twice before and it looks like one time was last month on April 15 but was discharged.  Mania started when he has lost 20 pounds on Ozempic.  I think if he does well on Depakote he could resume Ozempic.  Past Medical History:  Past Medical History:  Diagnosis Date   Diabetes mellitus without complication (HCC)    High cholesterol    Hypertension    Hypothyroidism    History reviewed. No pertinent surgical history. Family History: History reviewed. No pertinent family history.  Social History:  Social History   Substance and Sexual Activity  Alcohol Use Yes   Alcohol/week: 2.0 standard drinks   Types: 2 Shots of liquor per week   Comment: 1-2 shots at least twice a week     Social History   Substance and Sexual Activity  Drug Use Not Currently    Social History   Socioeconomic History   Marital status: Married    Spouse name: Not on file   Number of children: 2   Years of education: 9   Highest education level: 9th grade  Occupational History   Not  on file  Tobacco Use   Smoking status: Former    Types: Cigarettes   Smokeless tobacco: Former    Types: Chew   Tobacco comments:    Stopped smoking and chewing tobacco years ago  Building services engineer Use: Never used  Substance and Sexual Activity   Alcohol use: Yes    Alcohol/week: 2.0 standard drinks    Types: 2 Shots of liquor per week    Comment: 1-2 shots at least twice a week   Drug use: Not Currently   Sexual activity: Not on file  Other Topics Concern   Not on file  Social History Narrative   Married, lives with wife, 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   Social Determinants of Health   Financial Resource Strain: Not on file  Food Insecurity: Not on file  Transportation Needs: Not on file  Physical Activity: Not on file  Stress: Not on file  Social Connections: Not on file   Additional Social History:                         Sleep: Good  Appetite:  Good  Current Medications: Current Facility-Administered Medications  Medication Dose Route Frequency  Provider Last Rate Last Admin   alum & mag hydroxide-simeth (MAALOX/MYLANTA) 200-200-20 MG/5ML suspension 30 mL  30 mL Oral Q4H PRN [name], [name], NP       amLODipine (NORVASC) tablet 10 mg  10 mg Oral Daily Sarina IllHerrick, Raunel Dimartino Edward, DO   10 mg at 04/02/22 0912   divalproex (DEPAKOTE ER) 24 hr tablet 1,000 mg  1,000 mg Oral QHS Sarina IllHerrick, Harbert Fitterer Edward, DO       feeding supplement (GLUCERNA SHAKE) (GLUCERNA SHAKE) liquid 237 mL  237 mL Oral TID BM Sarina IllHerrick, Jodiann Ognibene Edward, DO   237 mL at 04/02/22 0915   folic acid (FOLVITE) tablet 1 mg  1 mg Oral Daily Gabriel CirriBarthold, [name], NP   1 mg at 04/02/22 0912   ibuprofen (ADVIL) tablet 400 mg  400 mg Oral Q6H PRN Sarina IllHerrick, Layla Kesling Edward, DO   400 mg at 04/01/22 1756   lisinopril (ZESTRIL) tablet 40 mg  40 mg Oral QPC breakfast Sarina IllHerrick, Osha Errico Edward, DO   40 mg at 04/02/22 0911   LORazepam (ATIVAN) tablet 1 mg  1 mg Oral Q4H PRN Sarina IllHerrick, Kanton Kamel  Edward, DO       magnesium hydroxide (MILK OF MAGNESIA) suspension 30 mL  30 mL Oral Daily PRN [name], [name], NP       metFORMIN (GLUCOPHAGE-XR) 24 hr tablet 500 mg  500 mg Oral BID WC Sarina IllHerrick, Jany Buckwalter Edward, DO   500 mg at 04/02/22 [id number]   multivitamin with minerals tablet 1 tablet  1 tablet Oral Daily [name], [name], NP   1 tablet at 04/02/22 0911   OLANZapine (ZYPREXA) tablet 10 mg  10 mg Oral Q6H PRN Sarina IllHerrick, Romello Hoehn Edward, DO       pantoprazole (PROTONIX) EC tablet 40 mg  40 mg Oral Daily Sarina IllHerrick, Grayson Pfefferle Edward, DO   40 mg at 04/02/22 0912   QUEtiapine (SEROQUEL) tablet 100 mg  100 mg Oral QHS Sarina IllHerrick, Zorina Mallin Edward, DO   100 mg at 04/01/22 2122   rosuvastatin (CRESTOR) tablet 10 mg  10 mg Oral Daily Sarina IllHerrick, Zola Runion Edward, DO   10 mg at 04/02/22 0911   temazepam (RESTORIL) capsule 15 mg  15 mg Oral QHS PRN Sarina IllHerrick, Syriana Croslin Edward, DO       thiamine tablet 100 mg  100 mg Oral Daily Gabriel CirriBarthold, [name], NP   100 mg at 04/02/22 [id number]   Or   thiamine (B-1) injection 100 mg  100 mg Intravenous Daily [name], [name], NP        Lab Results:  Results for orders placed or performed during the hospital encounter of 03/26/22 (from the past 48 hour(s))  Valproic acid level     Status: Abnormal   Collection Time: 04/02/22  6:55 AM  Result Value Ref Range   Valproic Acid Lvl 46 (L) 50.0 - 100.0 ug/mL    Comment: Performed at [facility], [address] [place], [place] [address]  Comprehensive metabolic panel     Status: Abnormal   Collection Time: 04/02/22  6:55 AM  Result Value Ref Range   Sodium 136 135 - 145 mmol/L   Potassium 4.9 3.5 - 5.1 mmol/L   Chloride 99 98 - 111 mmol/L   CO2 29 22 - 32 mmol/L   Glucose, Bld 114 (H) 70 - 99 mg/dL    Comment: Glucose reference range applies only to samples taken after fasting for at least 8 hours.   BUN 35 (H) 8 - 23 mg/dL   Creatinine, Ser  1.01 0.61 - 1.24 mg/dL   Calcium 8.9 8.9 - 02.5 mg/dL   Total Protein 6.7 6.5 - 8.1  g/dL   Albumin 3.5 3.5 - 5.0 g/dL   AST 21 15 - 41 U/L   ALT 22 0 - 44 U/L   Alkaline Phosphatase 59 38 - 126 U/L   Total Bilirubin 0.6 0.3 - 1.2 mg/dL   GFR, Estimated >85 >27 mL/min    Comment: (NOTE) Calculated using the CKD-EPI Creatinine Equation (2021)    Anion gap 8 5 - 15    Comment: Performed at [facility], [address] [place], [place] [address]    Blood Alcohol level:  Lab Results  Component Value Date   [facility] <10 03/25/2022    Metabolic Disorder Labs: Lab Results  Component Value Date   HGBA1C 6.7 (H) 03/30/2022   MPG 145.59 03/30/2022   No results found for: PROLACTIN Lab Results  Component Value Date   CHOL 143 03/30/2022   TRIG 197 (H) 03/30/2022   HDL 42 03/30/2022   CHOLHDL 3.4 03/30/2022   VLDL 39 03/30/2022   LDLCALC 62 03/30/2022    Physical Findings: AIMS:  , ,  ,  ,    CIWA:  CIWA-Ar Total: 3 COWS:     Musculoskeletal: Strength & Muscle Tone: within normal limits Gait & Station: normal Patient leans: N/A  Psychiatric Specialty Exam:  Presentation  General Appearance: Bizarre  Eye Contact:Good  Speech:Pressured  Speech Volume:Increased  Handedness:Right   Mood and Affect  Mood:Irritable  Affect:Inappropriate; Full Range   Thought Process  Thought Processes:Disorganized  Descriptions of Associations:Tangential  Orientation:Full (Time, Place and Person)  Thought Content:Illogical; Tangential  History of Schizophrenia/Schizoaffective disorder:No  Duration of Psychotic Symptoms:Greater than six months  Hallucinations:No data recorded Ideas of Reference:None  Suicidal Thoughts:No data recorded Homicidal Thoughts:No data recorded  Sensorium  Memory:Immediate Poor; Recent Fair; Remote Fair  Judgment:Impaired  Insight:Poor   Executive Functions  Concentration:Poor  Attention Span:Poor  Recall:Poor  Fund of Knowledge:Poor  Language:Poor   Psychomotor Activity  Psychomotor Activity:No  data recorded  Assets  Assets:Desire for Improvement; Resilience; Social Support   Sleep  Sleep:No data recorded   Physical Exam: Physical Exam Vitals and nursing note reviewed.  Constitutional:      Appearance: Normal appearance. He is normal weight.  Neurological:     General: No focal deficit present.     Mental Status: He is alert and oriented to person, place, and time.  Psychiatric:        Attention and Perception: Attention and perception normal.        Mood and Affect: Mood normal. Affect is angry.        Speech: Speech normal.        Behavior: Behavior is agitated.        Thought Content: Thought content normal.        Cognition and Memory: Cognition and memory normal.        Judgment: Judgment is inappropriate.   Review of Systems  Constitutional: Negative.   HENT: Negative.    Eyes: Negative.   Respiratory: Negative.    Cardiovascular: Negative.   Gastrointestinal: Negative.   Genitourinary: Negative.   Musculoskeletal: Negative.   Skin: Negative.   Neurological: Negative.   Endo/Heme/Allergies: Negative.   Psychiatric/Behavioral: Negative.    Blood pressure 125/60, pulse 72, temperature 98 F (36.7 C), temperature source Oral, resp. rate 20, height 6' (1.829 m), weight 107 kg, SpO2 90 %. Body mass index is  32.01 kg/m.   Treatment Plan Summary: Daily contact with patient to assess and evaluate symptoms and progress in treatment, Medication management, and Plan discontinue immediate release Depakote and start Depakote ER 1500 mg at bedtime.  He already received 500 this morning so will make it 1000 tonight and then change it to 1500 tomorrow.  Continue Seroquel 100 mg at bedtime.  Mariya Mottley Tresea Mall, DO 04/02/2022, 11:22 AM

## 2022-04-02 NOTE — Progress Notes (Signed)
Pt observed to be very preoccupied with discharge and suing the hospital "If I don't leave by 4:00 pm I will sue this hospital and own the parking lot. You guys will pay me $30 to park your cars up there. I own 10 acres of land that I gave to my daughter just to park all my belongings on it. He also very delusional, grandiose in nature. Believes he spoke to Florence Canner yesterday who asked him to come run for governor of [place]. He's  hyper-verbal, tangential and is difficult to redirect at times. Pt spoke with his daughter earlier as well.  1:1 observation maintained without incident at this time.

## 2022-04-02 NOTE — B[facility] Group Notes (Signed)
Pt a little drowsy this a.m , non combative, less talkative, mindful of peers. Pt has No new complaints

## 2022-04-02 NOTE — Progress Notes (Signed)
Patient was pleasant upon approach but labile, talkative. Requested to be taken outside for fresh air. Patient went out and stayed there for a while. Came back to the unit and currently eating dinner. Safety maintained on 1:1.

## 2022-04-02 NOTE — Plan of Care (Signed)
Patient continues to present Labile and Intrusive towards staff and peers.  Pt denies SI/HI/AV/H and contracts for safety.  Pt denies Depression/Anxiety.  Pt continues to participate in therapeutic milieu but needs constant redirection to not physically contact other peers and speak respectively. Pt did comply with medications and denies any adverse reactions. Q 15 minute safety checks in place.  Will continue plan of care.      Problem: Education: Goal: Knowledge of General Education information will improve Description: Including pain rating scale, medication(s)/side effects and non-pharmacologic comfort measures Outcome: Progressing   Problem: Health Behavior/Discharge Planning: Goal: Ability to manage health-related needs will improve Outcome: Progressing   Problem: Clinical Measurements: Goal: Ability to maintain clinical measurements within normal limits will improve Outcome: Progressing Goal: Will remain free from infection Outcome: Progressing

## 2022-04-02 NOTE — Progress Notes (Signed)
Pt alert, oriented to self, place and day of the week with prompts and cues. Denies SI, HI, AVH and pain when assessed. Cooperative with care and is verbally redirectable at this time. Observed eating lunch in dayroom and interacting well with peers and staff. 1:1 observation continues for falls without incident. Pt tolerated meals well and remains safe in milieu.

## 2022-04-02 NOTE — Plan of Care (Deleted)
Patient continues to present Labile and Intrusive towards staff and peers.  Pt denies SI/HI/AV/H and contracts for safety.  Pt denies Depression/Anxiety.  Pt continues to participate in therapeutic milieu but needs constant redirection to not physically contact other peers and speak respectively. Pt did comply with medications and denies any adverse reactions. Q 15 minute safety checks in place.  Will continue plan of care.      Problem: Education: Goal: Knowledge of General Education information will improve Description: Including pain rating scale, medication(s)/side effects and non-pharmacologic comfort measures Outcome: Progressing   Problem: Health Behavior/Discharge Planning: Goal: Ability to manage health-related needs will improve Outcome: Progressing   Problem: Clinical Measurements: Goal: Ability to maintain clinical measurements within normal limits will improve Outcome: Progressing Goal: Will remain free from infection Outcome: Progressing 

## 2022-04-03 DIAGNOSIS — F319 Bipolar disorder, unspecified: Secondary | ICD-10-CM | POA: Diagnosis not present

## 2022-04-03 MED ORDER — DIVALPROEX SODIUM ER 500 MG PO TB24
1500.0000 mg | ORAL_TABLET | Freq: Every day | ORAL | Status: DC
  Administered 2022-04-03: 1500 mg via ORAL
  Filled 2022-04-03: qty 3

## 2022-04-03 NOTE — Progress Notes (Signed)
Patient is currently sitting in the day room among staff and peers eating lunch in no apparent distress. 1:1 safety observation continue. No complain or discomfort voiced at this time. Will continue to monitor.

## 2022-04-03 NOTE — Progress Notes (Signed)
Recreation Therapy Notes  Date: 05/242023   Time: 1:30pm     Location: Craft room      Behavioral response: N/A   Intervention Topic:  Decision Making    Discussion/Intervention: Group unable to be held at this time due to another group taking place.    Clinical Observations/Feedback:  Group unable to be held at this time due to another group taking place.   Claiborne Stroble LRT/CTRS          Thamas Appleyard 04/03/2022 2:25 PM 

## 2022-04-03 NOTE — Progress Notes (Signed)
   04/03/22 2020  Psych Admission Type (Psych Patients Only)  Admission Status Voluntary  Psychosocial Assessment  Patient Complaints Restlessness;Other (Comment) (upset that he has not been discharged yet)  Eye Contact Fair  Facial Expression Animated  Affect Labile  Speech Pressured;Rapid;Loud  Ambulance person;Intrusive  Motor Activity Shuffling;Slow  Appearance/Hygiene In scrubs  Behavior Characteristics Irritable;Intrusive;Impulsive;Cooperative  Mood Labile  Thought Community education officer  Content Blaming others;Preoccupation;Delusions  Delusions Grandeur  Perception WDL  Hallucination None reported or observed  Judgment Poor  Confusion Mild  Danger to Self  Current suicidal ideation? Denies  Danger to Others  Danger to Others None reported or observed   Pt seen in dayroom. Pt denies SI, HI, AVH and pain. Pt is tangential with loud, rapid, pressured speech. Pt is animated and intrusive in the conversations of others in the dayroom. Pt is ruminating on his conversation with the doctor. "The doctor sat right there and lied to me. I was supposed to get my Ozempic shot on Saturday and it's now Wednesday. I was supposed to be out of here on Monday. I told that doctor today that he lied and he didn't say anything. Since he lied, that means I don't have to take any of his medicine. My doctor is in [place] and I take what he gives me." Pt provided education about his medications and the reason to take them. "Because you didn't lie to me, I'll take whatever medicines you give me." Pt took all nighttime meds without incident. Pt given scheduled medications as prescribed. PRNs as appropriate. 1:1 observation for safety. Pt safe on unit.

## 2022-04-03 NOTE — BHH Counselor (Signed)
CSW contacted pt's daughter ,Keenan Bachelor   [phone]  regarding discharge planning, who stated she was fine with discharge but felt that pt needed more improvement.   She stated that pt's son would be providing transportation and that CSW should reach out to determine time of pick up.   Danene Montijo [place], MSW, LCSW-A 5/24/20232:45 PM

## 2022-04-03 NOTE — BHH Counselor (Signed)
CSW contacted pt's son Cope Marte, [phone] to inform him of pt discharge tomorrow. He stated he had concerns that pt was unsafe to discharge because he is not as baseline and continues to talk with the family regarding being angry because he has a delusion that pt's wife is having an affair.  CSW advised that pt's family remove firearms from pt's home and have them locked and he stated he would do what he could.   He stated that pt does not take his medication when at home and that he is worried pt has not improved.  CSW will notify attending physician of concerns.  He stated he could still provide transportation for pt.

## 2022-04-03 NOTE — Plan of Care (Signed)
Patient remain alert, tangential and labile. Patient denies SI, HI, and AVH. Denies anxiety and depression. Denies pain and discomfort at this time. Report sleeping good. Stated "I want to get out of here." Ate breakfast in the day room among staff and peers with good appetite. Remain on 1:1 safety observation.   Problem: Education: Goal: Knowledge of General Education information will improve Description: Including pain rating scale, medication(s)/side effects and non-pharmacologic comfort measures Outcome: Progressing   Problem: Health Behavior/Discharge Planning: Goal: Ability to manage health-related needs will improve Outcome: Progressing   Problem: Clinical Measurements: Goal: Ability to maintain clinical measurements within normal limits will improve Outcome: Progressing Goal: Will remain free from infection Outcome: Progressing Goal: Diagnostic test results will improve Outcome: Progressing Goal: Respiratory complications will improve Outcome: Progressing Goal: Cardiovascular complication will be avoided Outcome: Progressing   Problem: Activity: Goal: Risk for activity intolerance will decrease Outcome: Progressing   Problem: Nutrition: Goal: Adequate nutrition will be maintained Outcome: Progressing   Problem: Coping: Goal: Level of anxiety will decrease Outcome: Progressing   Problem: Elimination: Goal: Will not experience complications related to bowel motility Outcome: Progressing Goal: Will not experience complications related to urinary retention Outcome: Progressing   Problem: Pain Managment: Goal: General experience of comfort will improve Outcome: Progressing   Problem: Safety: Goal: Ability to remain free from injury will improve Outcome: Progressing   Problem: Skin Integrity: Goal: Risk for impaired skin integrity will decrease Outcome: Progressing

## 2022-04-03 NOTE — Progress Notes (Signed)
Nursing 1:1 note D:Pt sleeping in recliner with eyes closed. RR even and unlabored. No distress noted.Pt used the bathroom @ 2130.  A: 1:1 observation continues for safety  R: Pt remains safe

## 2022-04-03 NOTE — Progress Notes (Signed)
Patient is alert and awake in the day room. Cooperative with care. Ambulatory with front wheel walker without falls. Assisted to the bathroom X1. 1:1 safety observation continues for falls without incident. Remain safe on the unit.

## 2022-04-03 NOTE — Progress Notes (Signed)
Patient is currently in the day room among staff and peers. Tangential and labile. Stating "I want to get out of here." Ate breakfast among peers with good appetite. Remain on 1:1 safety observation.

## 2022-04-03 NOTE — Progress Notes (Signed)
[facility] MD Progress Note  04/03/2022 12:04 PM Jorge Barrett  MRN:  [id number] Subjective: Jorge Barrett was seen on rounds.  He has been compliant with his medications.  Making when last changed before discharging him tomorrow.  He is going to be taking 1500 mg of Depakote ER at bedtime.  Also, continue with the Seroquel.  He has a longer span of time where he is not irritable.  He denies any side effects from his medications.  He denies any suicidal ideation or depression.  Principal Problem: Bipolar 1 disorder (HCC) Diagnosis: Principal Problem:   Bipolar 1 disorder (HCC)  Total Time spent with patient: 15 minutes  Past Psychiatric History: He tells me that his wife has committed him twice before and it looks like one time was last month on April 15 but was discharged.  Mania started when he has lost 20 pounds on Ozempic.  I think if he does well on Depakote he could resume Ozempic.  Past Medical History:  Past Medical History:  Diagnosis Date   Diabetes mellitus without complication (HCC)    High cholesterol    Hypertension    Hypothyroidism    History reviewed. No pertinent surgical history. Family History: History reviewed. No pertinent family history.  Social History:  Social History   Substance and Sexual Activity  Alcohol Use Yes   Alcohol/week: 2.0 standard drinks   Types: 2 Shots of liquor per week   Comment: 1-2 shots at least twice a week     Social History   Substance and Sexual Activity  Drug Use Not Currently    Social History   Socioeconomic History   Marital status: Married    Spouse name: Not on file   Number of children: 2   Years of education: 9   Highest education level: 9th grade  Occupational History   Not on file  Tobacco Use   Smoking status: Former    Types: Cigarettes   Smokeless tobacco: Former    Types: Chew   Tobacco comments:    Stopped smoking and chewing tobacco years ago  Building services engineer Use: Never used  Substance and Sexual  Activity   Alcohol use: Yes    Alcohol/week: 2.0 standard drinks    Types: 2 Shots of liquor per week    Comment: 1-2 shots at least twice a week   Drug use: Not Currently   Sexual activity: Not on file  Other Topics Concern   Not on file  Social History Narrative   Married, lives with wife, 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   Social Determinants of Health   Financial Resource Strain: Not on file  Food Insecurity: Not on file  Transportation Needs: Not on file  Physical Activity: Not on file  Stress: Not on file  Social Connections: Not on file   Additional Social History:                         Sleep: Good  Appetite:  Good  Current Medications: Current Facility-Administered Medications  Medication Dose Route Frequency Provider Last Rate Last Admin   alum & mag hydroxide-simeth (MAALOX/MYLANTA) 200-200-20 MG/5ML suspension 30 mL  30 mL Oral Q4H PRN [name] F, NP       amLODipine (NORVASC) tablet 10 mg  10 mg Oral Daily [name], DO   10 mg at 04/03/22 0902   divalproex (DEPAKOTE ER) 24 hr tablet 1,500 mg  1,500 mg Oral QHS [name], DO       feeding supplement (GLUCERNA SHAKE) (GLUCERNA SHAKE) liquid 237 mL  237 mL Oral TID BM [name], DO   237 mL at 04/03/22 5993   folic acid (FOLVITE) tablet 1 mg  1 mg Oral Daily [name] F, NP   1 mg at 04/03/22 5701   ibuprofen (ADVIL) tablet 400 mg  400 mg Oral Q6H PRN [name], DO   400 mg at 04/01/22 1756   lisinopril (ZESTRIL) tablet 40 mg  40 mg Oral QPC breakfast [name], DO   40 mg at 04/03/22 0902   LORazepam (ATIVAN) tablet 1 mg  1 mg Oral Q4H PRN [name], DO       magnesium hydroxide (MILK OF MAGNESIA) suspension 30 mL  30 mL Oral Daily PRN Vanetta Mulders, NP       metFORMIN (GLUCOPHAGE-XR) 24 hr tablet 500 mg  500 mg Oral BID WC [name], DO   500 mg at 04/03/22 7793    multivitamin with minerals tablet 1 tablet  1 tablet Oral Daily Vanetta Mulders, NP   1 tablet at 04/03/22 0902   OLANZapine (ZYPREXA) tablet 10 mg  10 mg Oral Q6H PRN [name], DO       pantoprazole (PROTONIX) EC tablet 40 mg  40 mg Oral Daily [name], DO   40 mg at 04/03/22 9030   QUEtiapine (SEROQUEL) tablet 100 mg  100 mg Oral QHS [name], DO   100 mg at 04/02/22 2136   rosuvastatin (CRESTOR) tablet 10 mg  10 mg Oral Daily [name], DO   10 mg at 04/03/22 0923   temazepam (RESTORIL) capsule 15 mg  15 mg Oral QHS PRN [name], DO   15 mg at 04/02/22 2136   thiamine tablet 100 mg  100 mg Oral Daily Vanetta Mulders, NP   100 mg at 04/03/22 3007   Or   thiamine (B-1) injection 100 mg  100 mg Intravenous Daily Vanetta Mulders, NP        Lab Results:  Results for orders placed or performed during the hospital encounter of 03/26/22 (from the past 48 hour(s))  Valproic acid level     Status: Abnormal   Collection Time: 04/02/22  6:55 AM  Result Value Ref Range   Valproic Acid Lvl 46 (L) 50.0 - 100.0 ug/mL    Comment: Performed at [facility], [address], [place], [place] [address]  Comprehensive metabolic panel     Status: Abnormal   Collection Time: 04/02/22  6:55 AM  Result Value Ref Range   Sodium 136 135 - 145 mmol/L   Potassium 4.9 3.5 - 5.1 mmol/L   Chloride 99 98 - 111 mmol/L   CO2 29 22 - 32 mmol/L   Glucose, Bld 114 (H) 70 - 99 mg/dL    Comment: Glucose reference range applies only to samples taken after fasting for at least 8 hours.   BUN 35 (H) 8 - 23 mg/dL   Creatinine, Ser 3.35 0.61 - 1.24 mg/dL   Calcium 8.9 8.9 - 45.6 mg/dL   Total Protein 6.7 6.5 - 8.1 g/dL   Albumin 3.5 3.5 - 5.0 g/dL   AST 21 15 - 41 U/L   ALT 22 0 - 44 U/L   Alkaline Phosphatase 59 38 - 126 U/L   Total Bilirubin 0.6 0.3 -  1.2 mg/dL   GFR, Estimated >16>60 >10>60 mL/min    Comment: (NOTE) Calculated using  the CKD-EPI Creatinine Equation (2021)    Anion gap 8 5 - 15    Comment: Performed at [facility], [address] [place], [place] [address]    Blood Alcohol level:  Lab Results  Component Value Date   [facility] 03/25/2022    Metabolic Disorder Labs: Lab Results  Component Value Date   HGBA1C 6.7 (H) 03/30/2022   MPG 145.59 03/30/2022   No results found for: PROLACTIN Lab Results  Component Value Date   CHOL 143 03/30/2022   TRIG 197 (H) 03/30/2022   HDL 42 03/30/2022   CHOLHDL 3.4 03/30/2022   VLDL 39 03/30/2022   LDLCALC 62 03/30/2022    Physical Findings: AIMS:  , ,  ,  ,    CIWA:  CIWA-Ar Total: 3 COWS:     Musculoskeletal: Strength & Muscle Tone: within normal limits Gait & Station: normal Patient leans: N/A  Psychiatric Specialty Exam:  Presentation  General Appearance: Bizarre  Eye Contact:Good  Speech:Pressured  Speech Volume:Increased  Handedness:Right   Mood and Affect  Mood:Irritable  Affect:Inappropriate; Full Range   Thought Process  Thought Processes:Disorganized  Descriptions of Associations:Tangential  Orientation:Full (Time, Place and Person)  Thought Content:Illogical; Tangential  History of Schizophrenia/Schizoaffective disorder:No  Duration of Psychotic Symptoms:Greater than six months  Hallucinations:No data recorded Ideas of Reference:None  Suicidal Thoughts:No data recorded Homicidal Thoughts:No data recorded  Sensorium  Memory:Immediate Poor; Recent Fair; Remote Fair  Judgment:Impaired  Insight:Poor   Executive Functions  Concentration:Poor  Attention Span:Poor  Recall:Poor  Fund of Knowledge:Poor  Language:Poor   Psychomotor Activity  Psychomotor Activity:No data recorded  Assets  Assets:Desire for Improvement; Resilience; Social Support   Sleep  Sleep:No data recorded   Physical Exam: Physical Exam Vitals and nursing note reviewed.  Constitutional:      Appearance:  Normal appearance. He is normal weight.  Neurological:     General: No focal deficit present.     Mental Status: He is alert and oriented to person, place, and time.  Psychiatric:        Attention and Perception: Attention and perception normal.        Mood and Affect: Mood normal. Affect is labile.        Speech: Speech is tangential.        Behavior: Behavior is agitated.        Thought Content: Thought content normal.        Cognition and Memory: Cognition and memory normal.        Judgment: Judgment is impulsive.   Review of Systems  Constitutional: Negative.   HENT: Negative.    Eyes: Negative.   Respiratory: Negative.    Cardiovascular: Negative.   Gastrointestinal: Negative.   Genitourinary: Negative.   Musculoskeletal: Negative.   Skin: Negative.   Neurological: Negative.   Endo/Heme/Allergies: Negative.   Psychiatric/Behavioral: Negative.    Blood pressure 129/67, pulse 80, temperature 97.9 F (36.6 C), temperature source Oral, resp. rate 18, height 6' (1.829 m), weight 107 kg, SpO2 100 %. Body mass index is 32.01 kg/m.   Treatment Plan Summary: Daily contact with patient to assess and evaluate symptoms and progress in treatment, Medication management, and Plan change Depakote ER to 1500 mg at bedtime.  He was on regular Depakote 500 twice a day and his level was 46.  Sarina Illichard Edward Zaylin Pistilli, DO 04/03/2022, 12:04 PM

## 2022-04-04 DIAGNOSIS — F319 Bipolar disorder, unspecified: Secondary | ICD-10-CM | POA: Diagnosis not present

## 2022-04-04 MED ORDER — QUETIAPINE FUMARATE 100 MG PO TABS: 100.0000 mg | ORAL_TABLET | Freq: Every day | ORAL | 3 refills | Status: AC

## 2022-04-04 MED ORDER — TEMAZEPAM 15 MG PO CAPS: 15.0000 mg | ORAL_CAPSULE | Freq: Every evening | ORAL | 0 refills | Status: AC | PRN

## 2022-04-04 MED ORDER — METFORMIN HCL ER 500 MG PO TB24: 500.0000 mg | ORAL_TABLET | Freq: Two times a day (BID) | ORAL | 3 refills | Status: AC

## 2022-04-04 MED ORDER — DIVALPROEX SODIUM ER 500 MG PO TB24: 1500.0000 mg | ORAL_TABLET | Freq: Every day | ORAL | 3 refills | Status: AC

## 2022-04-04 NOTE — BHH Suicide Risk Assessment (Signed)
[facility] Discharge Suicide Risk Assessment   Principal Problem: Bipolar 1 disorder [facility]) Discharge Diagnoses: Principal Problem:   Bipolar 1 disorder (HCC)   Total Time spent with patient: 1 hour  Musculoskeletal: Strength & Muscle Tone: within normal limits Gait & Station: unsteady Patient leans: N/A  Psychiatric Specialty Exam  Presentation  General Appearance: Bizarre  Eye Contact:Good  Speech:Pressured  Speech Volume:Increased  Handedness:Right   Mood and Affect  Mood:Irritable  Duration of Depression Symptoms: No data recorded Affect:Inappropriate; Full Range   Thought Process  Thought Processes:Disorganized  Descriptions of Associations:Tangential  Orientation:Full (Time, Place and Person)  Thought Content:Illogical; Tangential  History of Schizophrenia/Schizoaffective disorder:No  Duration of Psychotic Symptoms:Greater than six months  Hallucinations:No data recorded Ideas of Reference:None  Suicidal Thoughts:No data recorded Homicidal Thoughts:No data recorded  Sensorium  Memory:Immediate Poor; Recent Fair; Remote Fair  Judgment:Impaired  Insight:Poor   Executive Functions  Concentration:Poor  Attention Span:Poor  Recall:Poor  Fund of Knowledge:Poor  Language:Poor   Psychomotor Activity  Psychomotor Activity:No data recorded  Assets  Assets:Desire for Improvement; Resilience; Social Support   Sleep  Sleep:No data recorded   Blood pressure 130/75, pulse 78, temperature 97.9 F (36.6 C), temperature source Oral, resp. rate 18, height 6' (1.829 m), weight 107 kg, SpO2 98 %. Body mass index is 32.01 kg/m.  Mental Status Per Nursing Assessment::   On Admission:     Demographic Factors:  Male, Age 23 or older, and Caucasian  Loss Factors: NA  Historical Factors: NA  Risk Reduction Factors:   Sense of responsibility to family, Employed, Living with another person, especially a relative, Positive social support,  Positive therapeutic relationship, and Positive coping skills or problem solving skills   Cognitive Features That Contribute To Risk:  None    Suicide Risk:  Minimal: No identifiable suicidal ideation.  Patients presenting with no risk factors but with morbid ruminations; may be classified as minimal risk based on the severity of the depressive symptoms   Follow-up Information     [facility] Follow up on 04/09/2022.   Why: You have an appointment scheduled for follow up on Tuesday 04/09/22 at 11am (in person). Please bring social security ID, insurance cards and proof of income to  your appointment. Please contact [facility] if you need to reschedule. Thanks! Contact information: [address] [place] [address] [phone]                 Plan Of Care/Follow-up recommendations: Willaim Sheng, DO 04/04/2022, 10:23 AM

## 2022-04-04 NOTE — Progress Notes (Signed)
Recreation Therapy Notes   Date: 04/04/2022   Time: 1:35pm      Location: Craft room      Behavioral response: N/A   Intervention Topic: Wellness    Discussion/Intervention: Patient refused to attend group.    Clinical Observations/Feedback:  Patient refused to attend group.    Gaylon Melchor LRT/CTRS        Tiahna Cure 04/04/2022 2:26 PM

## 2022-04-04 NOTE — Discharge Summary (Signed)
Physician Discharge Summary Note  Patient:  Jorge Barrett is an 76 y.o., male MRN:  [id number] DOB:  01/06/1946 Patient phone:  [phone] (home)  Patient address:   [id number] [place] [address],  Total Time spent with patient: 1 hour  Date of Admission:  03/26/2022 Date of Discharge: 04/04/2022  Reason for Admission:   Jorge Barrett is a 52 year old white male who presents on an involuntary basis to the geriatric psychiatry unit for pressured speech, aggressive behavior, not sleeping, and disorganized behavior.  Apparently, he was started on Ozempic about 2 months ago and has lost 20 pounds and family states this is when his behavior started because he does not have any psychiatric history although there is a statement of a history of bipolar disorder which he denies.  He has never seen a psychiatrist and has never been psychiatrically hospitalized.  He denies depression, auditory hallucinations, suicidal ideation or past suicide attempts.  He states that he feels great since he lost his 20 pounds.  He does present with pressured speech and his thought process is tangential and circumstantial.  He is pleasant and cooperative and did take his medications.   PER INITIAL INTAKE: Jorge Barrett is a 76 y.o. male patient presented to [facility] ED via law enforcement under involuntary commitment status (IVC). Per the ED triage nurse note, Pt presents via IVC with Sheriff. Pt has hx bipolar. IVC paperwork reports pt is aggressive, carrying a gun at all times outside the hospital. It was reported that he was refusing to take medication.  The patient has a hx of bipolar disorder. The patient was aggressive; he was always carrying a gun outside of the hospital. Reports were refusing to take medication. Of note, pt was agitated and highly erratic during his initial assessment. The patient refuses to take any medication when the nurse offers him. The patient is threatening his wife and using  disparaging language toward his wife. The patient is preoccupied with anger/resentment towards his wife for having him IVC'd, repeatedly calling her a dumb ass. Throughout the assessment, pt. was impulsive and highly distractible.   This provider saw The patient face-to-face; the chart was reviewed, and consulted with Dr. Katrinka Blazing on 03/25/2022 due to the patient's care. It was discussed with the EDP that the patient remained under observation overnight and will be reassessed in the a.m. to determine if he meets the criteria for psychiatric inpatient admission; he could be discharged home.  On evaluation, the patient is alert and oriented x4, angry, uncooperative, and mood-congruent with affect. The patient does not appear to be responding to internal or external stimuli. The patient is presenting with delusional thinking. The patient denies auditory or visual hallucinations. The patient is presenting with psychotic and paranoid behaviors. During an encounter with the patient, he cannot answer most questions.  Principal Problem: Bipolar 1 disorder [facility]) Discharge Diagnoses: Principal Problem:   Bipolar 1 disorder [facility])   Past Psychiatric History:  He tells me that his wife has committed him twice before and it looks like one time was last month on April 15 but was discharged.  Mania started when he has lost 20 pounds on Ozempic.  I think if he does well on Depakote he could resume Ozempic.  Past Medical History:  Past Medical History:  Diagnosis Date   Diabetes mellitus without complication (HCC)    High cholesterol    Hypertension    Hypothyroidism    History reviewed. No pertinent surgical history.  Family History: History reviewed. No pertinent family history. Family Psychiatric  History: Unremarkable Social History:  Social History   Substance and Sexual Activity  Alcohol Use Yes   Alcohol/week: 2.0 standard drinks   Types: 2 Shots of liquor per week   Comment: 1-2 shots at least twice  a week     Social History   Substance and Sexual Activity  Drug Use Not Currently    Social History   Socioeconomic History   Marital status: Married    Spouse name: Not on file   Number of children: 2   Years of education: 9   Highest education level: 9th grade  Occupational History   Not on file  Tobacco Use   Smoking status: Former    Types: Cigarettes   Smokeless tobacco: Former    Types: Chew   Tobacco comments:    Stopped smoking and chewing tobacco years ago  Building services engineerVaping Use   Vaping Use: Never used  Substance and Sexual Activity   Alcohol use: Yes    Alcohol/week: 2.0 standard drinks    Types: 2 Shots of liquor per week    Comment: 1-2 shots at least twice a week   Drug use: Not Currently   Sexual activity: Not on file  Other Topics Concern   Not on file  Social History Narrative   Married, lives with wife, 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   Social Determinants of Health   Financial Resource Strain: Not on file  Food Insecurity: Not on file  Transportation Needs: Not on file  Physical Activity: Not on file  Stress: Not on file  Social Connections: Not on file    Hospital Course: Jorge Barrett was involuntarily admitted to geriatric psychiatry under routine orders and precautions.  He was initiated on Seroquel at bedtime and did well with this.  He eventually did sign a voluntary admission.  His mood remained irritable so he was started on Depakote which was eventually titrated up to 1500 mg of the extended release.  He also took Restoril on occasion and he did sleep well.  He was pleasant and cooperative for the most part.  He was loud and intrusive at times but this improved.  Part of this is part of his personality.  He denied depression or suicidality throughout his hospitalization.  His affect was always upbeat.  With the medication additions he had longer spans of being pleasant.  His last Depakote level was 46 when he was on immediate release 500  mg twice a day and that was on 04/02/2022.  It was felt that he maximize hospitalization and he was discharged home.  On the day of discharge he denied suicidal ideation, homicidal ideation, auditory or visual hallucinations.  His judgment and insight were fairly good.  Physical Findings: AIMS:  , ,  ,  ,    CIWA:  CIWA-Ar Total: 3 COWS:     Musculoskeletal: Strength & Muscle Tone: within normal limits Gait & Station: normal Patient leans: N/A   Psychiatric Specialty Exam:  Presentation  General Appearance: Bizarre  Eye Contact:Good  Speech:Pressured  Speech Volume:Increased  Handedness:Right   Mood and Affect  Mood:Irritable  Affect:Inappropriate; Full Range   Thought Process  Thought Processes:Disorganized  Descriptions of Associations:Tangential  Orientation:Full (Time, Place and Person)  Thought Content:Illogical; Tangential  History of Schizophrenia/Schizoaffective disorder:No  Duration of Psychotic Symptoms:Greater than six months  Hallucinations:No data recorded Ideas of Reference:None  Suicidal Thoughts:No data recorded Homicidal Thoughts:No data recorded  Sensorium  Memory:Immediate Poor; Recent Fair; Remote Fair  Judgment:Impaired  Insight:Poor   Executive Functions  Concentration:Poor  Attention Span:Poor  Recall:Poor  Fund of Knowledge:Poor  Language:Poor   Psychomotor Activity  Psychomotor Activity:No data recorded  Assets  Assets:Desire for Improvement; Resilience; Social Support   Sleep  Sleep:No data recorded   Physical Exam: Physical Exam Vitals and nursing note reviewed.  Constitutional:      Appearance: Normal appearance. He is normal weight.  Neurological:     General: No focal deficit present.     Mental Status: He is alert and oriented to person, place, and time.  Psychiatric:        Attention and Perception: Attention and perception normal.        Mood and Affect: Mood and affect normal.        Speech:  Speech normal.        Behavior: Behavior normal. Behavior is cooperative.        Thought Content: Thought content normal.        Cognition and Memory: Cognition and memory normal.        Judgment: Judgment normal.   Review of Systems  Constitutional: Negative.   HENT: Negative.    Eyes: Negative.   Respiratory: Negative.    Cardiovascular: Negative.   Gastrointestinal: Negative.   Genitourinary: Negative.   Musculoskeletal: Negative.   Skin: Negative.   Neurological: Negative.   Endo/Heme/Allergies: Negative.   Psychiatric/Behavioral: Negative.    Blood pressure 130/75, pulse 78, temperature 97.9 F (36.6 C), temperature source Oral, resp. rate 18, height 6' (1.829 m), weight 107 kg, SpO2 98 %. Body mass index is 32.01 kg/m.   Social History   Tobacco Use  Smoking Status Former   Types: Cigarettes  Smokeless Tobacco Former   Types: Chew  Tobacco Comments   Stopped smoking and chewing tobacco years ago   Tobacco Cessation:  N/A, patient does not currently use tobacco products   Blood Alcohol level:  Lab Results  Component Value Date   ETH <10 03/25/2022    Metabolic Disorder Labs:  Lab Results  Component Value Date   HGBA1C 6.7 (H) 03/30/2022   MPG 145.59 03/30/2022   No results found for: PROLACTIN Lab Results  Component Value Date   CHOL 143 03/30/2022   TRIG 197 (H) 03/30/2022   HDL 42 03/30/2022   CHOLHDL 3.4 03/30/2022   VLDL 39 03/30/2022   LDLCALC 62 03/30/2022    See Psychiatric Specialty Exam and Suicide Risk Assessment completed by Attending Physician prior to discharge.  Discharge destination:  Home  Is patient on multiple antipsychotic therapies at discharge:  No   Has Patient had three or more failed trials of antipsychotic monotherapy by history:  No  Recommended Plan for Multiple Antipsychotic Therapies: NA   Allergies as of 04/04/2022       Reactions   Sulfur    Other reaction(s): Unknown Other reaction(s): Unknown    Trimethoprim Rash        Medication List     STOP taking these medications    divalproex 500 MG DR tablet Commonly known as: DEPAKOTE Replaced by: divalproex 500 MG 24 hr tablet       TAKE these medications      Indication  allopurinol 100 MG tablet Commonly known as: ZYLOPRIM Take 100 mg by mouth daily.    amLODipine 10 MG tablet Commonly known as: NORVASC Take 10 mg by mouth daily.    divalproex 500 MG 24 hr  tablet Commonly known as: DEPAKOTE ER Take 3 tablets (1,500 mg total) by mouth at bedtime. Replaces: divalproex 500 MG DR tablet  Indication: Manic Phase of Manic-Depression   levothyroxine 75 MCG tablet Commonly known as: SYNTHROID Take 75 mcg by mouth daily.    lisinopril 40 MG tablet Commonly known as: ZESTRIL Take 40 mg by mouth daily.    metFORMIN 500 MG 24 hr tablet Commonly known as: GLUCOPHAGE-XR Take 1 tablet (500 mg total) by mouth 2 (two) times daily with a meal.  Indication: Type 2 Diabetes   omeprazole 20 MG capsule Commonly known as: PRILOSEC Take 20 mg by mouth daily.    QUEtiapine 100 MG tablet Commonly known as: SEROQUEL Take 1 tablet (100 mg total) by mouth at bedtime.  Indication: Manic-Depression   rosuvastatin 10 MG tablet Commonly known as: CRESTOR Take 10 mg by mouth daily.    Semaglutide (2 MG/DOSE) 8 MG/3ML Sopn Inject 2 mg into the skin once a week.    tadalafil 10 MG tablet Commonly known as: CIALIS Take 10 mg by mouth as needed.    tamsulosin 0.4 MG Caps capsule Commonly known as: FLOMAX Take 0.4 mg by mouth daily.    temazepam 15 MG capsule Commonly known as: RESTORIL Take 1 capsule (15 mg total) by mouth at bedtime as needed for sleep.         Follow-up Information     [facility] Follow up on 04/09/2022.   Why: You have an appointment scheduled for follow up on Tuesday 04/09/22 at 11am (in person). Please bring social security ID, insurance cards and proof of income to  your appointment.  Please contact [facility] if you need to reschedule. Thanks! Contact information: [address] [place] [address] [phone]                 Follow-up recommendations:  [facility] in [place], [place]    Signed: [name], DO 04/04/2022, 10:48 AM

## 2022-04-04 NOTE — Plan of Care (Signed)
  Problem: Group Participation Goal: STG - Patient will focus on task/topic with 2 prompts from staff within 5 recreation therapy group sessions Description: STG - Patient will focus on task/topic with 2 prompts from staff within 5 recreation therapy group sessions Outcome: Progressing   

## 2022-04-04 NOTE — Progress Notes (Signed)
Patient son arrived to pick up the patient for discharge. Patient verbalized understanding of being discharged, He was given all of his belongings and escorted out with unit staff.

## 2022-04-04 NOTE — Progress Notes (Signed)
Nursing 1:1 note D:Pt observed sleeping in recliner with eyes closed. Pt refuses to sleep in the bed. RR even and unlabored. No distress noted. A: 1:1 observation continues for safety  R: Pt remains safe

## 2022-04-04 NOTE — Progress Notes (Signed)
Nursing 1:1 note D:Pt observed sleeping in recliner with eyes closed. RR even and unlabored. No distress noted. A: 1:1 observation continues for safety  R: Pt remains safe

## 2022-04-04 NOTE — Progress Notes (Signed)
  [facility] Adult Case Management Discharge Plan :  Will you be returning to the same living situation after discharge:  Yes,  pt will be returning home  At discharge, do you have transportation home?: Yes,  pt's family member will be providing transportation Do you have the ability to pay for your medications: Yes,  yes pt has commercial generic insurance  Release of information consent forms completed and in the chart;  Patient's signature needed at discharge.  Patient to Follow up at:  Follow-up Information     [facility], [facility] Follow up on 04/09/2022.   Why: You have an appointment scheduled for follow up on Tuesday 04/09/22 at 11am (in person). Please bring social security ID, insurance cards and proof of income to  your appointment. Please contact [facility] if you need to reschedule. Thanks! Contact information: [address] [place] [address] [phone]                 Next level of care provider has access to [facility] Link:no  Safety Planning and Suicide Prevention discussed: Yes,  SPE completed with pt's daughter, Keenan Bachelor     Has patient been referred to the Quitline?: Patient refused referral  Patient has been referred for addiction treatment: N/A  Crystie Yanko A [place], LCSWA 04/04/2022, 10:33 AM

## 2022-04-04 NOTE — [facility] Counselor (Signed)
CSW contacted pt's daughter Keenan Bachelor, [phone], regarding pt's discharge. She stated that pt's son was going to provide transportation for pt but son was currently working and would not be available until 3:30/4 pm this afternoon to pick up pt.   No other requests were made. Conversation ended without incident.   Lionardo Haze [place], MSW, LCSW-A 5/25/20231:44 PM

## 2022-04-06 ENCOUNTER — Emergency Department (HOSPITAL_COMMUNITY)
Admission: EM | Admit: 2022-04-06 | Discharge: 2022-04-12 | Disposition: A | Discharge status: Home / Self Care | Payer: Medicare Other | Attending: Emergency Medicine | Admitting: Emergency Medicine

## 2022-04-06 ENCOUNTER — Other Ambulatory Visit: Payer: Self-pay

## 2022-04-06 DIAGNOSIS — F309 Manic episode, unspecified: Secondary | ICD-10-CM

## 2022-04-06 DIAGNOSIS — F319 Bipolar disorder, unspecified: Secondary | ICD-10-CM | POA: Diagnosis present

## 2022-04-06 DIAGNOSIS — Z79899 Other long term (current) drug therapy: Secondary | ICD-10-CM | POA: Insufficient documentation

## 2022-04-06 DIAGNOSIS — Z20822 Contact with and (suspected) exposure to covid-19: Secondary | ICD-10-CM | POA: Diagnosis not present

## 2022-04-06 DIAGNOSIS — E119 Type 2 diabetes mellitus without complications: Secondary | ICD-10-CM | POA: Diagnosis not present

## 2022-04-06 DIAGNOSIS — I10 Essential (primary) hypertension: Secondary | ICD-10-CM | POA: Diagnosis not present

## 2022-04-06 DIAGNOSIS — Z87891 Personal history of nicotine dependence: Secondary | ICD-10-CM | POA: Diagnosis not present

## 2022-04-06 DIAGNOSIS — E039 Hypothyroidism, unspecified: Secondary | ICD-10-CM | POA: Insufficient documentation

## 2022-04-06 DIAGNOSIS — Z046 Encounter for general psychiatric examination, requested by authority: Secondary | ICD-10-CM | POA: Diagnosis present

## 2022-04-06 LAB — URINALYSIS, ROUTINE W REFLEX MICROSCOPIC
Bacteria, UA: NONE SEEN
Bilirubin Urine: NEGATIVE
Glucose, UA: 50 mg/dL — AB
Hgb urine dipstick: NEGATIVE
Ketones, ur: NEGATIVE mg/dL
Nitrite: NEGATIVE
Protein, ur: NEGATIVE mg/dL
Specific Gravity, Urine: 1.005 (ref 1.005–1.030)
pH: 6 (ref 5.0–8.0)

## 2022-04-06 LAB — CBC WITH DIFFERENTIAL/PLATELET
Abs Immature Granulocytes: 0.06 10*3/uL (ref 0.00–0.07)
Basophils Absolute: 0.1 10*3/uL (ref 0.0–0.1)
Basophils Relative: 0 %
Eosinophils Absolute: 0.1 10*3/uL (ref 0.0–0.5)
Eosinophils Relative: 1 %
HCT: 43.4 % (ref 39.0–52.0)
Hemoglobin: 14.9 g/dL (ref 13.0–17.0)
Immature Granulocytes: 1 %
Lymphocytes Relative: 14 %
Lymphs Abs: 1.8 10*3/uL (ref 0.7–4.0)
MCH: 30.8 pg (ref 26.0–34.0)
MCHC: 34.3 g/dL (ref 30.0–36.0)
MCV: 89.9 fL (ref 80.0–100.0)
Monocytes Absolute: 1 10*3/uL (ref 0.1–1.0)
Monocytes Relative: 8 %
Neutro Abs: 9.9 10*3/uL — ABNORMAL HIGH (ref 1.7–7.7)
Neutrophils Relative %: 76 %
Platelets: 331 10*3/uL (ref 150–400)
RBC: 4.83 MIL/uL (ref 4.22–5.81)
RDW: 14.4 % (ref 11.5–15.5)
WBC: 12.8 10*3/uL — ABNORMAL HIGH (ref 4.0–10.5)
nRBC: 0 % (ref 0.0–0.2)

## 2022-04-06 LAB — COMPREHENSIVE METABOLIC PANEL
ALT: 28 U/L (ref 0–44)
AST: 29 U/L (ref 15–41)
Albumin: 4 g/dL (ref 3.5–5.0)
Alkaline Phosphatase: 73 U/L (ref 38–126)
Anion gap: 14 (ref 5–15)
BUN: 28 mg/dL — ABNORMAL HIGH (ref 8–23)
CO2: 21 mmol/L — ABNORMAL LOW (ref 22–32)
Calcium: 9.1 mg/dL (ref 8.9–10.3)
Chloride: 100 mmol/L (ref 98–111)
Creatinine, Ser: 1.03 mg/dL (ref 0.61–1.24)
GFR, Estimated: >60 mL/min (ref >60)
Glucose, Bld: 167 mg/dL — ABNORMAL HIGH (ref 70–99)
Potassium: 4.1 mmol/L (ref 3.5–5.1)
Sodium: 135 mmol/L (ref 135–145)
Total Bilirubin: 0.7 mg/dL (ref 0.3–1.2)
Total Protein: 7.8 g/dL (ref 6.5–8.1)

## 2022-04-06 LAB — ETHANOL: Alcohol, Ethyl (B): <10 mg/dL (ref <10)

## 2022-04-06 LAB — RAPID URINE DRUG SCREEN, HOSP PERFORMED
Amphetamines: NOT DETECTED
Barbiturates: NOT DETECTED
Benzodiazepines: POSITIVE — AB
Cocaine: NOT DETECTED
Opiates: NOT DETECTED
Tetrahydrocannabinol: NOT DETECTED

## 2022-04-06 LAB — RESP PANEL BY RT-PCR (FLU A&B, COVID) ARPGX2
Influenza A by PCR: NEGATIVE
Influenza B by PCR: NEGATIVE
SARS Coronavirus 2 by RT PCR: NEGATIVE

## 2022-04-06 MED ORDER — LISINOPRIL 20 MG PO TABS
40.0000 mg | ORAL_TABLET | Freq: Every day | ORAL | Status: DC
  Administered 2022-04-06 – 2022-04-12 (×7): 40 mg via ORAL
  Filled 2022-04-06 (×7): qty 2

## 2022-04-06 MED ORDER — LEVOTHYROXINE SODIUM 50 MCG PO TABS: 75.0000 ug | ORAL_TABLET | Freq: Every day | ORAL | Status: DC

## 2022-04-06 MED ORDER — AMLODIPINE BESYLATE 5 MG PO TABS
10.0000 mg | ORAL_TABLET | Freq: Every day | ORAL | Status: DC
  Administered 2022-04-06 – 2022-04-12 (×7): 10 mg via ORAL
  Filled 2022-04-06 (×7): qty 2

## 2022-04-06 MED ORDER — TEMAZEPAM 7.5 MG PO CAPS
7.5000 mg | ORAL_CAPSULE | Freq: Every evening | ORAL | Status: DC | PRN
  Administered 2022-04-06 – 2022-04-11 (×4): 7.5 mg via ORAL
  Filled 2022-04-06 (×6): qty 1

## 2022-04-06 MED ORDER — DIVALPROEX SODIUM ER 500 MG PO TB24
1500.0000 mg | ORAL_TABLET | Freq: Every day | ORAL | Status: DC
  Filled 2022-04-06: qty 3

## 2022-04-06 MED ORDER — QUETIAPINE FUMARATE 50 MG PO TABS: 50.0000 mg | ORAL_TABLET | Freq: Every day | ORAL | Status: DC

## 2022-04-06 MED ORDER — TEMAZEPAM 15 MG PO CAPS: 15.0000 mg | ORAL_CAPSULE | Freq: Every evening | ORAL | Status: DC | PRN

## 2022-04-06 MED ORDER — ZIPRASIDONE MESYLATE 20 MG IM SOLR
20.0000 mg | Freq: Once | INTRAMUSCULAR | Status: AC
  Administered 2022-04-06: 20 mg via INTRAMUSCULAR
  Filled 2022-04-06: qty 20

## 2022-04-06 MED ORDER — CLONAZEPAM 0.125 MG PO TBDP
0.2500 mg | ORAL_TABLET | Freq: Two times a day (BID) | ORAL | Status: DC | PRN
  Administered 2022-04-06 – 2022-04-07 (×2): 0.25 mg via ORAL
  Filled 2022-04-06 (×4): qty 2

## 2022-04-06 MED ORDER — QUETIAPINE FUMARATE 25 MG PO TABS
25.0000 mg | ORAL_TABLET | Freq: Once | ORAL | Status: AC
  Administered 2022-04-06: 25 mg via ORAL
  Filled 2022-04-06: qty 1

## 2022-04-06 MED ORDER — LEVOTHYROXINE SODIUM 50 MCG PO TABS
75.0000 ug | ORAL_TABLET | Freq: Every day | ORAL | Status: DC
  Administered 2022-04-06 – 2022-04-12 (×6): 75 ug via ORAL
  Filled 2022-04-06 (×6): qty 1

## 2022-04-06 MED ORDER — ALLOPURINOL 100 MG PO TABS
100.0000 mg | ORAL_TABLET | Freq: Every day | ORAL | Status: DC
  Administered 2022-04-06 – 2022-04-12 (×6): 100 mg via ORAL
  Filled 2022-04-06 (×7): qty 1

## 2022-04-06 MED ORDER — LORAZEPAM 1 MG PO TABS
1.0000 mg | ORAL_TABLET | ORAL | Status: DC | PRN
  Filled 2022-04-06 (×2): qty 1

## 2022-04-06 MED ORDER — ROSUVASTATIN CALCIUM 10 MG PO TABS
10.0000 mg | ORAL_TABLET | Freq: Every day | ORAL | Status: DC
  Administered 2022-04-08 – 2022-04-11 (×4): 10 mg via ORAL
  Filled 2022-04-06 (×8): qty 1

## 2022-04-06 MED ORDER — QUETIAPINE FUMARATE 100 MG PO TABS: 100.0000 mg | ORAL_TABLET | Freq: Every day | ORAL | Status: DC

## 2022-04-06 MED ORDER — ZIPRASIDONE MESYLATE 20 MG IM SOLR
20.0000 mg | Freq: Once | INTRAMUSCULAR | Status: AC
Start: 2022-04-06 — End: 2022-04-06
  Administered 2022-04-06: 20 mg via INTRAMUSCULAR
  Filled 2022-04-06: qty 20

## 2022-04-06 MED ORDER — QUETIAPINE FUMARATE 25 MG PO TABS
25.0000 mg | ORAL_TABLET | Freq: Two times a day (BID) | ORAL | Status: DC
  Administered 2022-04-06: 25 mg via ORAL
  Filled 2022-04-06: qty 1

## 2022-04-06 MED ORDER — QUETIAPINE FUMARATE 50 MG PO TABS
50.0000 mg | ORAL_TABLET | Freq: Two times a day (BID) | ORAL | Status: DC
  Administered 2022-04-06 – 2022-04-12 (×12): 50 mg via ORAL
  Filled 2022-04-06 (×13): qty 1

## 2022-04-06 MED ORDER — METFORMIN HCL ER 500 MG PO TB24
500.0000 mg | ORAL_TABLET | Freq: Two times a day (BID) | ORAL | Status: DC
  Administered 2022-04-06 – 2022-04-12 (×13): 500 mg via ORAL
  Filled 2022-04-06 (×13): qty 1

## 2022-04-06 MED ORDER — STERILE WATER FOR INJECTION IJ SOLN
INTRAMUSCULAR | Status: AC
  Administered 2022-04-06: 1.2 mL
  Filled 2022-04-06: qty 10

## 2022-04-06 MED ORDER — PANTOPRAZOLE SODIUM 40 MG PO TBEC
40.0000 mg | DELAYED_RELEASE_TABLET | Freq: Every day | ORAL | Status: DC
  Administered 2022-04-06 – 2022-04-12 (×7): 40 mg via ORAL
  Filled 2022-04-06 (×7): qty 1

## 2022-04-06 MED ORDER — STERILE WATER FOR INJECTION IJ SOLN
INTRAMUSCULAR | Status: AC
  Administered 2022-04-06: 10 mL
  Filled 2022-04-06: qty 10

## 2022-04-06 MED ORDER — MELATONIN 3 MG PO TABS: 3.0000 mg | ORAL_TABLET | Freq: Every day | ORAL | Status: DC

## 2022-04-06 MED ORDER — MELATONIN 3 MG PO TABS
3.0000 mg | ORAL_TABLET | Freq: Every day | ORAL | Status: DC
  Administered 2022-04-06 – 2022-04-11 (×6): 3 mg via ORAL
  Filled 2022-04-06 (×6): qty 1

## 2022-04-06 MED ORDER — TAMSULOSIN HCL 0.4 MG PO CAPS: 0.4000 mg | ORAL_CAPSULE | Freq: Every day | ORAL | Status: DC

## 2022-04-06 NOTE — ED Notes (Signed)
Pt is yelling at [place] he wants to leave and he wants to speak to the dr."RIGHT NOW".

## 2022-04-06 NOTE — BH Assessment (Signed)
Clinician spoke to RN Morrell Riddle and was informed that patient was too sleepy to be assessed at this time.  Pt had been given 20mg  Geodon at 03:49.

## 2022-04-06 NOTE — ED Provider Notes (Addendum)
9:20 PM-call received from nursing who states that the patient has not been sleeping, and is being disruptive on the unit, which is agitating other patients.  He has been started on medications including Seroquel, Klonopin, and his usual medications.  He received a dose of Geodon at 3:20 AM this morning.  Will order Ativan, 1 mg x 2 as needed over 1 hour and start nightly melatonin   [name], MD 04/06/22 2124   10:35 PM-he has now become increasingly agitated, yelling and trying to leave.  Security is at the doorway to prevent him from leaving.  Patient is delusional.  He is demanding to leave.  He did not improve with melatonin and Ativan given earlier.  He will be sedated and restrained.   [name], MD 04/06/22 2237

## 2022-04-06 NOTE — ED Provider Notes (Signed)
[facility]-EMERGENCY DEPT [facility] Emergency Department Provider Note MRN:  [id number]  Arrival date & time: 04/06/22     Chief Complaint   Psychiatric evaluation History of Present Illness   Jorge Barrett is a 76 y.o. year-old male with a history of hypertension, diabetes presenting to the ED with chief complaint of psychiatric evaluation.  Patient found laying on the floor of a gas station talking nonstop about various things.  Recent psychiatric hospitalization.  History of bipolar disorder.  Review of Systems  I was unable to obtain a full/accurate HPI, PMH, or ROS due to the patient's altered mental status.  Patient's Health History    Past Medical History:  Diagnosis Date   Diabetes mellitus without complication (HCC)    High cholesterol    Hypertension    Hypothyroidism     No past surgical history on file.  No family history on file.  Social History   Socioeconomic History   Marital status: Married    Spouse name: Not on file   Number of children: 2   Years of education: 9   Highest education level: 9th grade  Occupational History   Not on file  Tobacco Use   Smoking status: Former    Types: Cigarettes   Smokeless tobacco: Former    Types: Chew   Tobacco comments:    Stopped smoking and chewing tobacco years ago  Building services engineer Use: Never used  Substance and Sexual Activity   Alcohol use: Yes    Alcohol/week: 2.0 standard drinks    Types: 2 Shots of liquor per week    Comment: 1-2 shots at least twice a week   Drug use: Not Currently   Sexual activity: Not on file  Other Topics Concern   Not on file  Social History Narrative   Married, lives with wife, 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   Social Determinants of Health   Financial Resource Strain: Not on file  Food Insecurity: Not on file  Transportation Needs: Not on file  Physical Activity: Not on file  Stress: Not on file  Social Connections: Not on file   Intimate Partner Violence: Not on file     Physical Exam   Vitals:   04/06/22 0300 04/06/22 0425  BP: (!) 158/103 (!) 129/93  Pulse: (!) 101 99  Resp: 18 18  Temp:  97.9 F (36.6 C)  SpO2: 97% 98%    CONSTITUTIONAL: Well-appearing, mildly agitated NEURO/PSYCH: Constant pressured tangential speech, moves all extremities EYES:  eyes equal and reactive ENT/NECK:  no LAD, no JVD CARDIO: Regular rate, well-perfused, normal S1 and S2 PULM:  CTAB no wheezing or rhonchi GI/GU:  non-distended, non-tender MSK/SPINE:  No gross deformities, no edema SKIN:  no rash, atraumatic   *Additional and/or pertinent findings included in MDM below  Diagnostic and Interventional Summary    EKG Interpretation  Date/Time:    Ventricular Rate:    PR Interval:    QRS Duration:   QT Interval:    QTC Calculation:   R Axis:     Text Interpretation:         Labs Reviewed  COMPREHENSIVE METABOLIC PANEL - Abnormal; Notable for the following components:      Result Value   CO2 21 (*)    Glucose, Bld 167 (*)    BUN 28 (*)    All other components within normal limits  RAPID URINE DRUG SCREEN, HOSP PERFORMED - Abnormal; Notable for the following  components:   Benzodiazepines POSITIVE (*)    All other components within normal limits  CBC WITH DIFFERENTIAL/PLATELET - Abnormal; Notable for the following components:   WBC 12.8 (*)    Neutro Abs 9.9 (*)    All other components within normal limits  RESP PANEL BY RT-PCR (FLU A&B, COVID) ARPGX2  ETHANOL    No orders to display    Medications  ziprasidone (GEODON) injection 20 mg (20 mg Intramuscular Given 04/06/22 0349)  sterile water (preservative free) injection (10 mLs  Given 04/06/22 0350)     Procedures  /  Critical Care .Critical Care Performed by: Sabas Sous, MD Authorized by: Sabas Sous, MD   Critical care provider statement:    Critical care time (minutes):  35   Critical care was necessary to treat or prevent imminent  or life-threatening deterioration of the following conditions: Unstable psychiatric state, acute psychosis.   Critical care was time spent personally by me on the following activities:  Development of treatment plan with patient or surrogate, discussions with consultants, evaluation of patient's response to treatment, examination of patient, ordering and review of laboratory studies, ordering and review of radiographic studies, ordering and performing treatments and interventions, pulse oximetry, re-evaluation of patient's condition and review of old charts  ED Course and Medical Decision Making  Initial Impression and Ddx Suspect mania or acute psychosis, suspect will need psychiatric readmission.  Will IVC.  Past medical/surgical history that increases complexity of ED encounter: Bipolar disorder  Interpretation of Diagnostics I personally reviewed the laboratory assessment and my interpretation is as follows: No significant blood count or electrolyte disturbance      Patient Reassessment and Ultimate Disposition/Management Patient is now medically cleared awaiting TTS evaluation.  Signed out to default provider.  Patient management required discussion with the following services or consulting groups:  Psychiatry/TTS  Complexity of Problems Addressed Acute illness or injury that poses threat of life of bodily function  Additional Data Reviewed and Analyzed Further history obtained from: EMS on arrival  Additional Factors Impacting ED Encounter Risk Consideration of hospitalization  Elmer Sow. Pilar Plate, MD [facility] Emergency Medicine [facility] [email or web address]  Final Clinical Impressions(s) / ED Diagnoses     ICD-10-CM   1. [facility])  F30.9       ED Discharge Orders     None        Discharge Instructions Discussed with and Provided to Patient:   Discharge Instructions   None      Sabas Sous, MD 04/06/22 [phone]

## 2022-04-06 NOTE — ED Notes (Signed)
PT keeps getting up coming to the door aggressively and threatening to leave and call the police on [place]

## 2022-04-06 NOTE — Consult Note (Addendum)
[facility] ED ASSESSMENT   Reason for Consult:  Psychiatry evaluation Referring Physician:  ER Physician Patient Identification: Jorge Barrett MRN:  [id number] ED Chief Complaint: <principal problem not specified>  Diagnosis:  Active Problems:   Bipolar 1 disorder [facility])   ED Assessment Time Calculation: Start Time: 1330 Stop Time: 1405 Total Time in Minutes (Assessment Completion): 35   Subjective:   Jorge Barrett is a 76 y.o. male patient admitted with with no known hx of Mental illness until he lost 20 LBS from taking Ozempic two months ago.  He was recently hospitalized at [facility] Geriatric unit and discharged home after few days.  This time patient was seen on the flor of a gas station talking non stop about various things and was brought to [facility].  HPI:  Patient was seen by provider this am in his room awake, speaking very loud and carrying conversation with unseen people.  He talks and laughs and changes from one topic to another.  Patient could not engage in meaningful conversation with provider.  He only asked if Provider is from [place] and went on to state that a lady from [place] on Line offered him $5,000 to have sex with her.  He immediately changed the topic.  We resumed all home medications including Quetiapine at night and Restoril.  Based on his age Restoril will be decreased with plan to wean off.  Quetiapine is changed to add day time dose to manage his mood.  He gets Depakote at night time.  We will continue to make Medication management for stabilization.  Patient meets inpatient hospitalization for safety and stabilization and we will seek bed placement at any Geropsychiatry unit with available bed.  Past Psychiatric History: Bipolar disorder, One short hospitalization at [facility] Geriatric unit.  Risk to Self or Others: Is the patient at risk to self? Yes Has the patient been a risk to self in the past 6 months? Yes Has the patient been a risk to self within the  distant past? Yes Is the patient a risk to others? No Has the patient been a risk to others in the past 6 months? No Has the patient been a risk to others within the distant past? No  [place] Scale:  Flowsheet Row ED from 04/06/2022 in [facility] Admission (Discharged) from 03/26/2022 in [facility] BEHAVIORAL [facility]ICINE ED from 03/25/2022 in [facility]  C-SSRS RISK CATEGORY No Risk No Risk No Risk       AIMS:  , , ,  ,   ASAM:    Substance Abuse:     Past Medical History:  Past Medical History:  Diagnosis Date   Diabetes mellitus without complication (HCC)    High cholesterol    Hypertension    Hypothyroidism    No past surgical history on file. Family History: No family history on file. Family Psychiatric  History: unknown Social History:  Social History   Substance and Sexual Activity  Alcohol Use Yes   Alcohol/week: 2.0 standard drinks   Types: 2 Shots of liquor per week   Comment: 1-2 shots at least twice a week     Social History   Substance and Sexual Activity  Drug Use Not Currently    Social History   Socioeconomic History   Marital status: Married    Spouse name: Not on file   Number of children: 2   Years of education: 9   Highest education level: 9th grade  Occupational History   Not on file  Tobacco Use   Smoking status: Former    Types: Cigarettes   Smokeless tobacco: Former    Types: Chew   Tobacco comments:    Stopped smoking and chewing tobacco years ago  Vaping Use   Vaping Use: Never used  Substance and Sexual Activity   Alcohol use: Yes    Alcohol/week: 2.0 standard drinks    Types: 2 Shots of liquor per week    Comment: 1-2 shots at least twice a week   Drug use: Not Currently   Sexual activity: Not on file  Other Topics Concern   Not on file  Social History Narrative   Married, lives with wife, 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    Social Determinants of Health   Financial Resource Strain: Not on file  Food Insecurity: Not on file  Transportation Needs: Not on file  Physical Activity: Not on file  Stress: Not on file  Social Connections: Not on file   Additional Social History:    Allergies:   Allergies  Allergen Reactions   Sulfur     Other reaction(s): Unknown Other reaction(s): Unknown    Trimethoprim Rash    Labs:  Results for orders placed or performed during the hospital encounter of 04/06/22 (from the past 48 hour(s))  Urine rapid drug screen (hosp performed)     Status: Abnormal   Collection Time: 04/06/22  3:40 AM  Result Value Ref Range   Opiates NONE DETECTED NONE DETECTED   Cocaine NONE DETECTED NONE DETECTED   Benzodiazepines POSITIVE (A) NONE DETECTED   Amphetamines NONE DETECTED NONE DETECTED   Tetrahydrocannabinol NONE DETECTED NONE DETECTED   Barbiturates NONE DETECTED NONE DETECTED    Comment: (NOTE) DRUG SCREEN FOR MEDICAL PURPOSES ONLY.  IF CONFIRMATION IS NEEDED FOR ANY PURPOSE, NOTIFY LAB WITHIN 5 DAYS.  LOWEST DETECTABLE LIMITS FOR URINE DRUG SCREEN Drug Class                     Cutoff (ng/mL) Amphetamine and metabolites    1000 Barbiturate and metabolites    200 Benzodiazepine                 200 Tricyclics and metabolites     300 Opiates and metabolites        300 Cocaine and metabolites        300 THC                            50 Performed at [facility], [address], [place], [place] [address]   Comprehensive metabolic panel     Status: Abnormal   Collection Time: 04/06/22  4:12 AM  Result Value Ref Range   Sodium 135 135 - 145 mmol/L   Potassium 4.1 3.5 - 5.1 mmol/L   Chloride 100 98 - 111 mmol/L   CO2 21 (L) 22 - 32 mmol/L   Glucose, Bld 167 (H) 70 - 99 mg/dL    Comment: Glucose reference range applies only to samples taken after fasting for at least 8 hours.   BUN 28 (H) 8 - 23 mg/dL   Creatinine, Ser 9.62 0.61 - 1.24 mg/dL    Calcium 9.1 8.9 - 95.2 mg/dL   Total Protein 7.8 6.5 - 8.1 g/dL   Albumin 4.0 3.5 - 5.0 g/dL   AST 29 15 - 41 U/L   ALT 28  0 - 44 U/L   Alkaline Phosphatase 73 38 - 126 U/L   Total Bilirubin 0.7 0.3 - 1.2 mg/dL   GFR, Estimated >78 >29 mL/min    Comment: (NOTE) Calculated using the CKD-EPI Creatinine Equation (2021)    Anion gap 14 5 - 15    Comment: Performed at [facility], [address] [place], [place] [address]  CBC with Diff     Status: Abnormal   Collection Time: 04/06/22  4:12 AM  Result Value Ref Range   WBC 12.8 (H) 4.0 - 10.5 K/uL   RBC 4.83 4.22 - 5.81 MIL/uL   Hemoglobin 14.9 13.0 - 17.0 g/dL   HCT 08.6 57.8 - 46.9 %   MCV 89.9 80.0 - 100.0 fL   MCH 30.8 26.0 - 34.0 pg   MCHC 34.3 30.0 - 36.0 g/dL   RDW 62.9 52.8 - 41.3 %   Platelets 331 150 - 400 K/uL   nRBC 0.0 0.0 - 0.2 %   Neutrophils Relative % 76 %   Neutro Abs 9.9 (H) 1.7 - 7.7 K/uL   Lymphocytes Relative 14 %   Lymphs Abs 1.8 0.7 - 4.0 K/uL   Monocytes Relative 8 %   Monocytes Absolute 1.0 0.1 - 1.0 K/uL   Eosinophils Relative 1 %   Eosinophils Absolute 0.1 0.0 - 0.5 K/uL   Basophils Relative 0 %   Basophils Absolute 0.1 0.0 - 0.1 K/uL   Immature Granulocytes 1 %   Abs Immature Granulocytes 0.06 0.00 - 0.07 K/uL    Comment: Performed at [facility], [address], [place], [place] [address]  Resp Panel by RT-PCR (Flu A&B, Covid) Anterior Nasal Swab     Status: None   Collection Time: 04/06/22  4:13 AM   Specimen: Anterior Nasal Swab  Result Value Ref Range   SARS Coronavirus 2 by RT PCR NEGATIVE NEGATIVE    Comment: (NOTE) SARS-CoV-2 target nucleic acids are NOT DETECTED.  The SARS-CoV-2 RNA is generally detectable in upper respiratory specimens during the acute phase of infection. The lowest concentration of SARS-CoV-2 viral copies this assay can detect is 138 copies/mL. A negative result does not preclude SARS-Cov-2 infection and should not be used  as the sole basis for treatment or other patient management decisions. A negative result may occur with  improper specimen collection/handling, submission of specimen other than nasopharyngeal swab, presence of viral mutation(s) within the areas targeted by this assay, and inadequate number of viral copies(<138 copies/mL). A negative result must be combined with clinical observations, patient history, and epidemiological information. The expected result is Negative.  Fact Sheet for Patients:  BloggerCourse.com  Fact Sheet for Healthcare Providers:  SeriousBroker.it  This test is no t yet approved or cleared by the [place] FDA and  has been authorized for detection and/or diagnosis of SARS-CoV-2 by FDA under an Emergency Use Authorization (EUA). This EUA will remain  in effect (meaning this test can be used) for the duration of the COVID-19 declaration under Section 564(b)(1) of the Act, 21 U.S.C.section 360bbb-3(b)(1), unless the authorization is terminated  or revoked sooner.       Influenza A by PCR NEGATIVE NEGATIVE   Influenza B by PCR NEGATIVE NEGATIVE    Comment: (NOTE) The Xpert Xpress SARS-CoV-2/FLU/RSV plus assay is intended as an aid in the diagnosis of influenza from Nasopharyngeal swab specimens and should not be used as a sole basis for treatment. Nasal washings and aspirates are unacceptable for Xpert  Xpress SARS-CoV-2/FLU/RSV testing.  Fact Sheet for Patients: BloggerCourse.com  Fact Sheet for Healthcare Providers: SeriousBroker.it  This test is not yet approved or cleared by the [place] FDA and has been authorized for detection and/or diagnosis of SARS-CoV-2 by FDA under an Emergency Use Authorization (EUA). This EUA will remain in effect (meaning this test can be used) for the duration of the COVID-19 declaration under Section 564(b)(1) of the Act,  21 U.S.C. section 360bbb-3(b)(1), unless the authorization is terminated or revoked.  Performed at [facility], [address], [place], [place] [address]   Ethanol     Status: None   Collection Time: 04/06/22  4:13 AM  Result Value Ref Range   Alcohol, Ethyl (B) <10 <10 mg/dL    Comment: (NOTE) Lowest detectable limit for serum alcohol is 10 mg/dL.  For medical purposes only. Performed at [facility], [address] [place], [place] [address]     Current Facility-Administered Medications  Medication Dose Route Frequency Provider Last Rate Last Admin   allopurinol (ZYLOPRIM) tablet 100 mg  100 mg Oral Daily Sabas Sous, MD   100 mg at 04/06/22 0940   amLODipine (NORVASC) tablet 10 mg  10 mg Oral Daily Sabas Sous, MD   10 mg at 04/06/22 0940   divalproex (DEPAKOTE ER) 24 hr tablet 1,500 mg  1,500 mg Oral QHS Sabas Sous, MD       levothyroxine (SYNTHROID) tablet 75 mcg  75 mcg Oral Q0600 Sabas Sous, MD   75 mcg at 04/06/22 0940   lisinopril (ZESTRIL) tablet 40 mg  40 mg Oral Daily Sabas Sous, MD   40 mg at 04/06/22 0942   metFORMIN (GLUCOPHAGE-XR) 24 hr tablet 500 mg  500 mg Oral BID WC Sabas Sous, MD   500 mg at 04/06/22 0940   pantoprazole (PROTONIX) EC tablet 40 mg  40 mg Oral Daily Sabas Sous, MD   40 mg at 04/06/22 0940   QUEtiapine (SEROQUEL) tablet 25 mg  25 mg Oral BID Dahlia Byes C, NP       QUEtiapine (SEROQUEL) tablet 50 mg  50 mg Oral QHS Daylan Boggess C, NP       rosuvastatin (CRESTOR) tablet 10 mg  10 mg Oral QHS Sabas Sous, MD       temazepam (RESTORIL) capsule 7.5 mg  7.5 mg Oral QHS PRN Earney Navy, NP       Current Outpatient Medications  Medication Sig Dispense Refill   allopurinol (ZYLOPRIM) 100 MG tablet Take 100 mg by mouth daily.     amLODipine (NORVASC) 10 MG tablet Take 10 mg by mouth daily.     levothyroxine (SYNTHROID) 75 MCG tablet Take 75 mcg by mouth  daily.     lisinopril (ZESTRIL) 40 MG tablet Take 40 mg by mouth daily.     omeprazole (PRILOSEC) 20 MG capsule Take 20 mg by mouth daily.     rosuvastatin (CRESTOR) 10 MG tablet Take 10 mg by mouth daily.     Semaglutide, 2 MG/DOSE, 8 MG/3ML SOPN Inject 2 mg into the skin once a week. Sunday     divalproex (DEPAKOTE ER) 500 MG 24 hr tablet Take 3 tablets (1,500 mg total) by mouth at bedtime. 90 tablet 3   metFORMIN (GLUCOPHAGE-XR) 500 MG 24 hr tablet Take 1 tablet (500 mg total) by mouth 2 (two) times daily with a meal. 60 tablet 3   QUEtiapine (SEROQUEL) 100  MG tablet Take 1 tablet (100 mg total) by mouth at bedtime. 30 tablet 3   tadalafil (CIALIS) 10 MG tablet Take 10 mg by mouth as needed for erectile dysfunction.     tamsulosin (FLOMAX) 0.4 MG CAPS capsule Take 0.4 mg by mouth daily. (Patient not taking: Reported on 04/06/2022)     temazepam (RESTORIL) 15 MG capsule Take 1 capsule (15 mg total) by mouth at bedtime as needed for sleep. 30 capsule 0    Musculoskeletal: Strength & Muscle Tone: within normal limits Gait & Station: normal Patient leans: Front   Psychiatric Specialty Exam: Presentation  General Appearance: Appropriate for Environment; Fairly Groomed  Eye Contact:Good  Speech:Pressured; Other (comment) (Very loud, flight of ideas.)  Speech Volume:Increased  Handedness:Right   Mood and Affect  Mood:Euphoric  Affect:Congruent   Thought Process  Thought Processes:Disorganized; Irrevelant  Descriptions of Associations:Tangential  Orientation:Partial  Thought Content:Illogical; Tangential  History of Schizophrenia/Schizoaffective disorder:No  Duration of Psychotic Symptoms:Greater than six months  Hallucinations:Hallucinations: None  Ideas of Reference:None  Suicidal Thoughts:Suicidal Thoughts: No  Homicidal Thoughts:Homicidal Thoughts: No   Sensorium  Memory:Immediate Fair; Recent Poor; Remote Poor  Judgment:Fair  Insight:Poor   Executive  Functions  Concentration:Poor  Attention Span:Poor  Recall:Poor  Fund of Knowledge:Poor  Language:Fair   Psychomotor Activity  Psychomotor Activity:Psychomotor Activity: Normal  Assets  Assets:Communication Skills; Housing; Intimacy    Sleep  Sleep:Sleep: Fair  Physical Exam:  Unable to engage in any meaningful conversation due to Manic symptoms. Physical Exam ROS Blood pressure (!) 155/75, pulse 100, temperature 97.8 F (36.6 C), temperature source Oral, resp. rate 18, weight 113.4 kg, SpO2 99 %. Body mass index is 33.91 kg/m.  Medical Decision Making: Based on assessment patient is accepted for admission in a Geropsychiatry unit.  We will seek bed placement at any facility that has available bed.  Problem 1: Bipolar Disorder, Manic Disposition:  Meets criteria for inpatient Hospitalization, admit when bed is available, continue Medication management in the ER.Obtain a UA as soon as possible.  Earney NavyJosephine C Jhase Creppel, NP-PMHNP-BC 04/06/2022 2:00 PM

## 2022-04-06 NOTE — ED Notes (Signed)
Patient manic. Loud .    Hyperverbal

## 2022-04-06 NOTE — ED Notes (Signed)
Pt. agitated and is talking about guns and shooting. Trying to compare his private parts to the sitters private parts.

## 2022-04-07 MED ORDER — DIVALPROEX SODIUM 125 MG PO CSDR
500.0000 mg | DELAYED_RELEASE_CAPSULE | Freq: Three times a day (TID) | ORAL | Status: DC
  Administered 2022-04-07 – 2022-04-12 (×15): 500 mg via ORAL
  Filled 2022-04-07 (×16): qty 4

## 2022-04-07 NOTE — Progress Notes (Signed)
Per Dahlia Byes, NP, patient meets criteria for inpatient treatment. There are no available beds at [facility] today, per [facility]. CSW faxed referrals to the following facilities for review:  [facility]  Pending - Request Sent N/A [address] [place] [address] [phone] --  [facility]  Pending - Request Sent N/A [address] [place] [address] [phone] --  [facility]  Pending - Request Sent N/A [address], [place] [address] [phone] --  [facility]  Pending - Request Sent N/A [address] [place] [address] [phone] --  [facility]  Pending - Request Sent N/A [address], [place] [address] [phone] --  [facility]  Pending - Request Sent N/A [address] [place] [address] [phone] --  [facility]  Pending - Request Sent N/A [address] [place] [address] [phone] --   TTS will continue to seek bed placement.  Crissie Reese, MSW, Lenice Pressman Phone: [phone] Disposition/TOC

## 2022-04-07 NOTE — ED Notes (Signed)
Patient alert.   Agitated. Yelling at staff. Demanding. Uncooperative.   Difficult to redirect.

## 2022-04-07 NOTE — ED Provider Notes (Signed)
Emergency Medicine Observation Re-evaluation Note  Jorge Barrett is a 76 y.o. male, seen on rounds today.  Pt initially presented to the ED for complaints of Psychiatric Evaluation (Pt arrived via EMS from [employer] in the floor yelling out about multiple complaints. ) Currently, the patient is calm, but he has had at least 2 or 3 episodes of outburst in the last 12 hours however patient was agitated.  Physical Exam  BP (!) 149/68   Pulse 91   Temp (!) 97.4 F (36.3 C) (Oral)   Resp 20   Wt 113.4 kg   SpO2 100%   BMI 33.91 kg/m  Physical Exam General: No acute distress Cardiac: No respiratory distress and regular rate Lungs: No respiratory distress Psych: Currently calm  ED Course / MDM  EKG:EKG Interpretation  Date/Time:  Saturday Apr 06 2022 16:29:27 EDT Ventricular Rate:  90 PR Interval:  150 QRS Duration: 92 QT Interval:  352 QTC Calculation: 430 R Axis:   -55 Text Interpretation: Normal sinus rhythm Left anterior fascicular block Abnormal ECG No previous ECGs available Confirmed by Daleen Bo [phone]) on 04/06/2022 4:40:38 PM  I have reviewed the labs performed to date as well as medications administered while in observation.  Recent changes in the last 24 hours include multiple episodes of agitation and uncooperative behavior, patient has been difficult to redirect.  Requires intermittent redirection.  Patient is awaiting psychiatric bed.  Plan  Current plan is for continued ED management.,  Awaiting psych placement for. Jorge Barrett is under involuntary commitment.      Varney Biles, MD 04/07/22 [phone]

## 2022-04-07 NOTE — Consult Note (Signed)
Patient was seen talking and yelling holding the phone to his ear.  He had Geodon and Restraints last night due to severe agitation and inability to redirect.  This morning he continues constantly talking to seen and unseen people.  He has been refusing his Depakote tablet.  Same has been changed to sprinkle and given with ICE Cream.  His Seroquel ia increased as well to manage his mood.  He yells at staff wanting to go home.  With the Depakote changed to sprinkle and Clonazepam scheduled twice a day low dose as needed we will be monitoring for some improvement.  He continues to require inpatient Hospitalization.

## 2022-04-07 NOTE — ED Notes (Signed)
Continues to yell at staff.   Saying he is going home.

## 2022-04-08 MED ORDER — STERILE WATER FOR INJECTION IJ SOLN
INTRAMUSCULAR | Status: AC
  Administered 2022-04-08: 1.2 mL
  Filled 2022-04-08: qty 10

## 2022-04-08 MED ORDER — ZIPRASIDONE MESYLATE 20 MG IM SOLR
10.0000 mg | Freq: Once | INTRAMUSCULAR | Status: AC
  Administered 2022-04-08: 10 mg via INTRAMUSCULAR
  Filled 2022-04-08: qty 20

## 2022-04-08 MED ORDER — LORAZEPAM 2 MG/ML IJ SOLN
1.0000 mg | Freq: Once | INTRAMUSCULAR | Status: AC
  Administered 2022-04-08: 1 mg via INTRAMUSCULAR
  Filled 2022-04-08: qty 1

## 2022-04-08 MED ORDER — CLONAZEPAM 0.125 MG PO TBDP
0.2500 mg | ORAL_TABLET | Freq: Three times a day (TID) | ORAL | Status: DC
  Administered 2022-04-08 – 2022-04-12 (×11): 0.25 mg via ORAL
  Filled 2022-04-08 (×11): qty 2

## 2022-04-08 NOTE — ED Notes (Addendum)
NT sat in pts room for 1 hour. During this time pt speech was pressured, rapid, and a continuous succession of unrelated thoughts and ideas with frequent abrupt shifts in topic. Pt talked about his $80,000 truck, big house, boat, his millionaire daughter, and he has the big bucks. Then pt said, "I need to know your name. What's your phone number? Are you married or have a boyfriend? What's your boyfriend's number? You can come to my house, and I'll show you a good time. I'll show you what you have been missing all your life." Now, NT sitting outside pt room.

## 2022-04-08 NOTE — Progress Notes (Signed)
[facility] In[facility]al Health Placement  Pt meets inpatient criteria per Dahlia Byes, NP. There are no appropriate beds available at [facility] for [facility] placement.  Referral was sent to the following facilities;   Destination Service Provider Address Phone Fax  [facility]  [address], [place] [address] [phone]  [facility]  [address] [place] [address] [phone]  [facility]  [address] [place], [place] [address] [phone]  [facility]  [address] [place] [address] [phone]  [facility]  [address] [place] [address] [phone]  [facility]  [address] [place] [address] [phone]  [facility]  [address] [place] [address] [phone]  [facility] [address]  [address], [place] [address] [phone]  [facility]  [address], [place] [address] [phone]  [facility]  [address] [place] [address] [phone]  [facility]  [address], [place] [address] [phone]  [facility]  [address] [place] [address] [phone]  [facility]  [address] [place] [address] [phone]  [facility]  [address], [place] [address] [phone]  [facility]  [address] [place] [address] [phone]  [facility]  [address], [place] [address] [phone]  [facility]  [address] [place] [address] [phone]  [facility]  [address], [place] [address] [phone]  [facility]  [address] [place] [address] [phone]    Situation ongoing,  CSW will follow up.   Maryjean Ka, MSW, LCSWA 04/08/2022  @ 8:51 PM

## 2022-04-08 NOTE — ED Provider Notes (Signed)
Blood pressure 115/60, pulse 90, temperature 98 F (36.7 C), temperature source Oral, resp. rate 18, weight 113.4 kg, SpO2 96 %.  In short, Jorge Barrett is a 76 y.o. male with a chief complaint of Psychiatric Evaluation (Pt arrived via EMS from [employer] in the floor yelling out about multiple complaints. ) .  Refer to the original H&P for additional details.  Patient with moderate agitation and defiance overnight. He is leaving his room and threw a pitcher of water. He is refusing PRN medications. Ultimately required IM Geodon and Ativan for symptom mgmt but restraints not required.     Maia Plan, MD 04/08/22 [phone]

## 2022-04-08 NOTE — ED Notes (Signed)
Encouraged pt to take his 8 AM and 10 AM medications. Pt compliant with medication. Pt took medication over a 20 minute timeframe. During this time pt speech was a rapid, continuous succession of unrelated thoughts and ideas with frequent abrupt shifts in topic. Pt appears to have difficultly hearing. Pt spoke loudly and asked me to repeat myself multiple times.

## 2022-04-08 NOTE — Progress Notes (Cosign Needed Addendum)
[facility]  04/08/2022 1:53 PM Jorge MossJimmy David Barrett  MRN:  [id number]   Subjective:  Patient remains Manic sat this time.  He talks constantly his voice hoax.  Patient is not fully compliant with Medications especially with some staff members.  His speech is tangential, Flight of ideas and loose.  He received Geodon early this morning for  inappropriately touching staff, yelling and was difficult to redirect.  Currently he is awake now and eating some of his lunch.  Patient continues to meet criteria for inpatient Psychiatric hospitalization.  We continue to seek inpatient bed at a Geropsychiatry unit.  Medication management  continues while waiting for bed.  Clonazepam 0.25 mg to be given three times a day. Principal Problem: <principal problem not specified> Diagnosis:  Active Problems:   Bipolar 1 disorder [facility](HCC)   ED Assessment Time Calculation: Start Time: 0140 Stop Time: 0153 Total Time in Minutes (Assessment Completion): 13   Past Psychiatric History: see initial Psychiatric evaluation note  [place] Scale:  Flowsheet Row [facility] (Discharged) [facility]  C-SSRS RISK CATEGORY No Risk No Risk No Risk       Past Medical History:  Past Medical History:  Diagnosis Date   Diabetes mellitus without complication (HCC)    High cholesterol    Hypertension    Hypothyroidism    No past surgical history on file. Family History: No family history on file. Family Psychiatric  History: see initial psychiatric note Social History:  Social History   Substance and Sexual Activity  Alcohol Use Yes   Alcohol/week: 2.0 standard drinks   Types: 2 Shots of liquor per week   Comment: 1-2 shots at least twice a week     Social History   Substance and Sexual Activity  Drug Use Not Currently     Social History   Socioeconomic History   Marital status: Married    Spouse name: Not on file   Number of children: 2   Years of education: 9   Highest education level: 9th grade  Occupational History   Not on file  Tobacco Use   Smoking status: Former    Types: Cigarettes   Smokeless tobacco: Former    Types: Chew   Tobacco comments:    Stopped smoking and chewing tobacco years ago  Building services engineerVaping Use   Vaping Use: Never used  Substance and Sexual Activity   Alcohol use: Yes    Alcohol/week: 2.0 standard drinks    Types: 2 Shots of liquor per week    Comment: 1-2 shots at least twice a week   Drug use: Not Currently   Sexual activity: Not on file  Other Topics Concern   Not on file  Social History Narrative   Married, lives with wife, 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   Social Determinants of Health   Financial Resource Strain: Not on file  Food Insecurity: Not on file  Transportation Needs: Not on file  Physical Activity: Not on file  Stress: Not on file  Social Connections: Not on file    Sleep: Fair  Appetite:  Fair  Current Medications: Current Facility-Administered Medications  Medication Dose Route Frequency Provider Last Rate Last Admin   allopurinol (ZYLOPRIM) tablet 100 mg  100 mg Oral Daily Sabas SousBero, Michael M, MD   100 mg at 04/08/22 1147  amLODipine (NORVASC) tablet 10 mg  10 mg Oral Daily Sabas Sous, MD   10 mg at 04/08/22 1145   clonazepam (KLONOPIN) disintegrating tablet 0.25 mg  0.25 mg Oral TID Dahlia Byes C, NP       divalproex (DEPAKOTE SPRINKLE) capsule 500 mg  500 mg Oral TID Dahlia Byes C, NP   500 mg at 04/08/22 1144   levothyroxine (SYNTHROID) tablet 75 mcg  75 mcg Oral Q0600 Sabas Sous, MD   75 mcg at 04/07/22 0907   lisinopril (ZESTRIL) tablet 40 mg  40 mg Oral Daily Sabas Sous, MD   40 mg at 04/08/22 1145   LORazepam (ATIVAN) tablet 1 mg  1 mg Oral Q1H PRN [name], MD       melatonin  tablet 3 mg  3 mg Oral QHS [name], MD   3 mg at 04/08/22 0100   metFORMIN (GLUCOPHAGE-XR) 24 hr tablet 500 mg  500 mg Oral BID WC Sabas Sous, MD   500 mg at 04/08/22 1146   pantoprazole (PROTONIX) EC tablet 40 mg  40 mg Oral Daily Sabas Sous, MD   40 mg at 04/08/22 1145   QUEtiapine (SEROQUEL) tablet 50 mg  50 mg Oral BID Dahlia Byes C, NP   50 mg at 04/08/22 1144   rosuvastatin (CRESTOR) tablet 10 mg  10 mg Oral QHS Sabas Sous, MD   10 mg at 04/08/22 0100   temazepam (RESTORIL) capsule 7.5 mg  7.5 mg Oral QHS PRN Dahlia Byes C, NP   7.5 mg at 04/06/22 2057   Current Outpatient Medications  Medication Sig Dispense Refill   allopurinol (ZYLOPRIM) 100 MG tablet Take 100 mg by mouth daily.     amLODipine (NORVASC) 10 MG tablet Take 10 mg by mouth daily.     levothyroxine (SYNTHROID) 75 MCG tablet Take 75 mcg by mouth daily.     lisinopril (ZESTRIL) 40 MG tablet Take 40 mg by mouth daily.     omeprazole (PRILOSEC) 20 MG capsule Take 20 mg by mouth daily.     rosuvastatin (CRESTOR) 10 MG tablet Take 10 mg by mouth daily.     Semaglutide, 2 MG/DOSE, 8 MG/3ML SOPN Inject 2 mg into the skin once a week. Sunday     tamsulosin (FLOMAX) 0.4 MG CAPS capsule Take 0.4 mg by mouth daily.     divalproex (DEPAKOTE ER) 500 MG 24 hr tablet Take 3 tablets (1,500 mg total) by mouth at bedtime. (Patient not taking: Reported on 04/06/2022) 90 tablet 3   metFORMIN (GLUCOPHAGE-XR) 500 MG 24 hr tablet Take 1 tablet (500 mg total) by mouth 2 (two) times daily with a meal. (Patient not taking: Reported on 04/06/2022) 60 tablet 3   QUEtiapine (SEROQUEL) 100 MG tablet Take 1 tablet (100 mg total) by mouth at bedtime. (Patient not taking: Reported on 04/06/2022) 30 tablet 3   tadalafil (CIALIS) 10 MG tablet Take 10 mg by mouth as needed for erectile dysfunction.     temazepam (RESTORIL) 15 MG capsule Take 1 capsule (15 mg total) by mouth at bedtime as needed for sleep. (Patient not taking:  Reported on 04/06/2022) 30 capsule 0    Lab Results:  Results for orders placed or performed during the hospital encounter of 04/06/22 (from the past 48 hour(s))  Urinalysis, Routine w reflex microscopic Urine, Clean Catch     Status: Abnormal   Collection Time: 04/06/22  1:59 PM  Result Value Ref Range  Color, Urine STRAW (A) YELLOW   APPearance CLEAR CLEAR   Specific Gravity, Urine 1.005 1.005 - 1.030   pH 6.0 5.0 - 8.0   Glucose, UA 50 (A) NEGATIVE mg/dL   Hgb urine dipstick NEGATIVE NEGATIVE   Bilirubin Urine NEGATIVE NEGATIVE   Ketones, ur NEGATIVE NEGATIVE mg/dL   Protein, ur NEGATIVE NEGATIVE mg/dL   Nitrite NEGATIVE NEGATIVE   Leukocytes,Ua TRACE (A) NEGATIVE   RBC / HPF 0-5 0 - 5 RBC/hpf   WBC, UA 0-5 0 - 5 WBC/hpf   Bacteria, UA NONE SEEN NONE SEEN   Mucus PRESENT     Comment: Performed at [facility], [address] [place], [place] [address]    Blood Alcohol level:  Lab Results  Component Value Date   [facility] <10 04/06/2022   ETH <10 03/25/2022    Physical Findings:  CIWA:    COWS:     Musculoskeletal: Strength & Muscle Tone: within normal limits Gait & Station: normal Patient leans: Front  Psychiatric Specialty Exam:  Presentation  General Appearance: Casual; Neat  Eye Contact:Good  Speech:Clear and Coherent; Pressured; Other (comment) (Rapid, voice getting hoax from talikng non stop)  Speech Volume:Normal  Handedness:Right   Mood and Affect  Mood:Labile (Manic, confused)  Affect:Congruent; Labile   Thought Process  Thought Processes:Disorganized  Descriptions of Associations:Tangential  Orientation:Full (Time, Place and Person)  Thought Content:Illogical; Logical  History of Schizophrenia/Schizoaffective disorder:No  Duration of Psychotic Symptoms:Greater than six months  Hallucinations:Hallucinations: None  Ideas of Reference:None  Suicidal Thoughts:Suicidal Thoughts: No  Homicidal Thoughts:Homicidal  Thoughts: No   Sensorium  Memory:Immediate Poor; Recent Poor; Remote Poor  Judgment:Poor  Insight:Poor   Executive Functions  Concentration:Poor  Attention Span:Poor  Recall:Poor  Fund of Knowledge:Poor  Language:Fair   Psychomotor Activity  Psychomotor Activity:Psychomotor Activity: Increased   Assets  Assets:Communication Skills; Housing; Intimacy; Financial Resources/Insurance   Sleep  Sleep:Sleep: Fair  Physical Exam: Unable to engage in assessment. Physical Exam ROS Blood pressure 105/65, pulse 77, temperature 98 F (36.7 C), temperature source Oral, resp. rate 18, weight 113.4 kg, SpO2 97 %. Body mass index is 33.91 kg/m.   Medical Decision Making: Continue Medication management, continue seeking inpatient Geropsychiatry unit admission   Problem 1: Bipolar 1 disorder-Manic  Earney Navy, NP-PMHNP-BC 04/08/2022, 1:53 PM

## 2022-04-08 NOTE — ED Notes (Signed)
Patient is touching staff sexually. He woke up yelling and screaming at staff. He started throwing his water pitchers across the room. He started yelling at staff that he is going to getup and leave and that we cant stop him.We can try but we wont stop him. Patient then started banging his hands on the bedside table. MD notified and new orders given.

## 2022-04-08 NOTE — ED Provider Notes (Signed)
Emergency Medicine Observation Re-evaluation Note  Jorge Barrett is a 76 y.o. male, seen on rounds today.  Pt initially presented to the ED for complaints of Psychiatric Evaluation (Pt arrived via EMS from [employer] in the floor yelling out about multiple complaints. ) Currently, the patient is sleeping.  Physical Exam  BP 115/60 (BP Location: Right Arm)   Pulse 90   Temp 98 F (36.7 C) (Oral)   Resp 18   Wt 113.4 kg   SpO2 96%   BMI 33.91 kg/m  Physical Exam General: Sleeping Cardiac: Extremities well-perfused Lungs: Breathing is even and unlabored Psych: Deferred  ED Course / MDM  EKG:EKG Interpretation  Date/Time:  Saturday Apr 06 2022 16:29:27 EDT Ventricular Rate:  90 PR Interval:  150 QRS Duration: 92 QT Interval:  352 QTC Calculation: 430 R Axis:   -55 Text Interpretation: Normal sinus rhythm Left anterior fascicular block Abnormal ECG No previous ECGs available Confirmed by [name] [phone]) on 04/06/2022 4:40:38 PM  I have reviewed the labs performed to date as well as medications administered while in observation.  Recent changes in the last 24 hours include agitation and defiance overnight.  Received IM Geodon and Ativan.  Plan  Current plan is for psych placement. Jorge Barrett is under involuntary commitment.      Gloris Manchester, MD 04/08/22 1721

## 2022-04-09 NOTE — Progress Notes (Signed)
Patient has been denied by [facility] and has been faxed out. Patient meets BH inpatient criteria per Dahlia Byes, NP. Patient has been faxed out to the following facilities:   [facility]  [address], [place] [address] [phone]  [facility]  [address], [place] [address] [phone]  [facility]  [address] [place], [place] [address] [phone]  [facility]  [address] [place] [address] [phone]  [facility]  [address], [place] [address] [phone]  [facility]  [address], [place] [address] [phone]  [facility]  [address] [place] [address] [phone]  [facility] [address]  [address] [place] [address] [phone]  [facility]  [address], [place] [address] [phone]  [facility]  [address] [place] [address] [phone]  [facility]  [address], [place] [address] [phone]  [facility]  [address], [place] [address] [phone]  [facility]  [address] [place] [address] [phone]  [facility]  [address] [place] [address] [phone]  [facility]  [address] [place] [address] [phone]  [facility]  [address], [place] [address] [phone]  [facility]  [address] [place] [address] [phone]  [facility]  [address],  [place] [address] [phone]  [facility]  [address] [place] [address] [phone]   Damita Dunnings, MSW, LCSW-A  3:26 PM 04/09/2022

## 2022-04-09 NOTE — ED Provider Notes (Signed)
Emergency Medicine Observation Re-evaluation Note  Jorge Barrett is a 76 y.o. male, seen on rounds today.  Pt initially presented to the ED for complaints of Psychiatric Evaluation (Pt arrived via EMS from [employer] in the floor yelling out about multiple complaints. ) Currently, the patient is awake with no complaints.  Physical Exam  BP 122/63 (BP Location: Right Arm)   Pulse 79   Temp 97.9 F (36.6 C) (Oral)   Resp 17   Wt 113.4 kg   SpO2 98%   BMI 33.91 kg/m  Physical Exam Pulmonary:     Effort: Pulmonary effort is normal.  Neurological:     Mental Status: He is alert.  Psychiatric:        Mood and Affect: Mood normal.     ED Course / MDM  EKG:EKG Interpretation  Date/Time:  Saturday Apr 06 2022 16:29:27 EDT Ventricular Rate:  90 PR Interval:  150 QRS Duration: 92 QT Interval:  352 QTC Calculation: 430 R Axis:   -55 Text Interpretation: Normal sinus rhythm Left anterior fascicular block Abnormal ECG No previous ECGs available Confirmed by [name] [phone]) on 04/06/2022 4:40:38 PM  I have reviewed the labs performed to date as well as medications administered while in observation.  Recent changes in the last 24 hours include nothing.  Plan  Current plan is for geripsych placement. Celestine Bougie is under involuntary commitment.      Virgina Norfolk, DO 04/09/22 [phone]

## 2022-04-09 NOTE — Progress Notes (Signed)
[facility] Psych ED Progress Note  04/09/2022 6:52 PM Colbin Jovel  MRN:  [id number]   Subjective:  Patient has developed hoax voice from constantly talking.  Patient changes from one topic to another.  He continues to receive his Psychotropic especially Depakpte sprinkle in Ice cream or Apple source.  Twice patient was inappropriate telling this provider that he has been in contact with a Lady in [place] who he is going to $5,000 or $3,000 for sex.  Patient believes he will be travelling to [place] to see the [place].  Difficult to redirect him.  Over all he is not as manic as he was last weekend.  He is now sleeping more than when he came in.  We will continue to seek inpatient hospitalization.  His voice is getting hoaxed from talking.  Clonazepam is increased to 0.25 mg tid.  We will continue to manage his Medications while seeking bed placement.  If he clears more we sill discharge him from the ER. Principal Problem: <principal problem not specified> Diagnosis:  Active Problems:   Bipolar 1 disorder [facility])   ED Assessment Time Calculation: Start Time: 1832 Stop Time: 1852 Total Time in Minutes (Assessment Completion): 20   Past Psychiatric History: see initial Psychiatric evaluation note  [place] Scale:  Flowsheet Row ED from 04/06/2022 in [facility] Admission (Discharged) from 03/26/2022 in [facility] ED from 03/25/2022 in [facility]  C-SSRS RISK CATEGORY No Risk No Risk No Risk       Past Medical History:  Past Medical History:  Diagnosis Date   Diabetes mellitus without complication (HCC)    High cholesterol    Hypertension    Hypothyroidism    No past surgical history on file. Family History: No family history on file. Family Psychiatric  History: see initial Psych evaluation note Social History:  Social History   Substance and Sexual Activity  Alcohol Use Yes   Alcohol/week:  2.0 standard drinks   Types: 2 Shots of liquor per week   Comment: 1-2 shots at least twice a week     Social History   Substance and Sexual Activity  Drug Use Not Currently    Social History   Socioeconomic History   Marital status: Married    Spouse name: Not on file   Number of children: 2   Years of education: 9   Highest education level: 9th grade  Occupational History   Not on file  Tobacco Use   Smoking status: Former    Types: Cigarettes   Smokeless tobacco: Former    Types: Chew   Tobacco comments:    Stopped smoking and chewing tobacco years ago  Building services engineer Use: Never used  Substance and Sexual Activity   Alcohol use: Yes    Alcohol/week: 2.0 standard drinks    Types: 2 Shots of liquor per week    Comment: 1-2 shots at least twice a week   Drug use: Not Currently   Sexual activity: Not on file  Other Topics Concern   Not on file  Social History Narrative   Married, lives with wife, 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   Social Determinants of Health   Financial Resource Strain: Not on file  Food Insecurity: Not on file  Transportation Needs: Not on file  Physical Activity: Not on file  Stress: Not on file  Social Connections: Not on file    Sleep: Good  Appetite:  Good  Current Medications: Current Facility-Administered Medications  Medication Dose Route Frequency Provider Last Rate Last Admin   allopurinol (ZYLOPRIM) tablet 100 mg  100 mg Oral Daily Sabas SousBero, Michael M, MD   100 mg at 04/09/22 0958   amLODipine (NORVASC) tablet 10 mg  10 mg Oral Daily Sabas SousBero, Michael M, MD   10 mg at 04/09/22 [id number]   clonazepam (KLONOPIN) disintegrating tablet 0.25 mg  0.25 mg Oral TID Dahlia Byesnuoha, Jarelis Ehlert C, NP   0.25 mg at 04/09/22 1516   divalproex (DEPAKOTE SPRINKLE) capsule 500 mg  500 mg Oral TID Dahlia Byesnuoha, Dollie Mayse C, NP   500 mg at 04/09/22 1516   levothyroxine (SYNTHROID) tablet 75 mcg  75 mcg Oral Q0600 Sabas SousBero, Michael M, MD   75 mcg at  04/09/22 0716   lisinopril (ZESTRIL) tablet 40 mg  40 mg Oral Daily Sabas SousBero, Michael M, MD   40 mg at 04/09/22 0956   LORazepam (ATIVAN) tablet 1 mg  1 mg Oral Q1H PRN Mancel BaleWentz, Elliott, MD       melatonin tablet 3 mg  3 mg Oral QHS Mancel BaleWentz, Elliott, MD   3 mg at 04/08/22 2018   metFORMIN (GLUCOPHAGE-XR) 24 hr tablet 500 mg  500 mg Oral BID WC Sabas SousBero, Michael M, MD   500 mg at 04/09/22 1654   pantoprazole (PROTONIX) EC tablet 40 mg  40 mg Oral Daily Sabas SousBero, Michael M, MD   40 mg at 04/09/22 0958   QUEtiapine (SEROQUEL) tablet 50 mg  50 mg Oral BID Dahlia Byesnuoha, Veretta Sabourin C, NP   50 mg at 04/09/22 [id number]   rosuvastatin (CRESTOR) tablet 10 mg  10 mg Oral QHS Sabas SousBero, Michael M, MD   10 mg at 04/08/22 2200   temazepam (RESTORIL) capsule 7.5 mg  7.5 mg Oral QHS PRN Dahlia Byesnuoha, Brylinn Teaney C, NP   7.5 mg at 04/06/22 2057   Current Outpatient Medications  Medication Sig Dispense Refill   allopurinol (ZYLOPRIM) 100 MG tablet Take 100 mg by mouth daily.     amLODipine (NORVASC) 10 MG tablet Take 10 mg by mouth daily.     levothyroxine (SYNTHROID) 75 MCG tablet Take 75 mcg by mouth daily.     lisinopril (ZESTRIL) 40 MG tablet Take 40 mg by mouth daily.     omeprazole (PRILOSEC) 20 MG capsule Take 20 mg by mouth daily.     rosuvastatin (CRESTOR) 10 MG tablet Take 10 mg by mouth daily.     Semaglutide, 2 MG/DOSE, 8 MG/3ML SOPN Inject 2 mg into the skin once a week. Sunday     tamsulosin (FLOMAX) 0.4 MG CAPS capsule Take 0.4 mg by mouth daily.     divalproex (DEPAKOTE ER) 500 MG 24 hr tablet Take 3 tablets (1,500 mg total) by mouth at bedtime. (Patient not taking: Reported on 04/06/2022) 90 tablet 3   metFORMIN (GLUCOPHAGE-XR) 500 MG 24 hr tablet Take 1 tablet (500 mg total) by mouth 2 (two) times daily with a meal. (Patient not taking: Reported on 04/06/2022) 60 tablet 3   QUEtiapine (SEROQUEL) 100 MG tablet Take 1 tablet (100 mg total) by mouth at bedtime. (Patient not taking: Reported on 04/06/2022) 30 tablet 3   tadalafil (CIALIS)  10 MG tablet Take 10 mg by mouth as needed for erectile dysfunction.     temazepam (RESTORIL) 15 MG capsule Take 1 capsule (15 mg total) by mouth at bedtime as needed for sleep. (Patient not taking: Reported on 04/06/2022) 30 capsule 0    Lab Results:  No results found for this or any previous visit (from the past 48 hour(s)).  Blood Alcohol level:  Lab Results  Component Value Date   ETH <10 04/06/2022   ETH <10 03/25/2022    Physical Findings:  CIWA:    COWS:     Musculoskeletal: Strength & Muscle Tone: within normal limits Gait & Station: normal Patient leans: Front  Psychiatric Specialty Exam:  Presentation  General Appearance: Fairly Groomed; Neat  Eye Contact:Good  Speech:Clear and Coherent; Pressured  Speech Volume:Normal  Handedness:Right   Mood and Affect  Mood:Euphoric; Anxious  Affect:Congruent; Full Range   Thought Process  Thought Processes:Coherent; Goal Directed  Descriptions of Associations:Tangential  Orientation:Full (Time, Place and Person)  Thought Content:Delusions; Tangential  History of Schizophrenia/Schizoaffective disorder:No  Duration of Psychotic Symptoms:Greater than six months  Hallucinations:Hallucinations: None  Ideas of Reference:None  Suicidal Thoughts:Suicidal Thoughts: No  Homicidal Thoughts:Homicidal Thoughts: No   Sensorium  Memory:Immediate Fair; Recent Fair; Remote Fair  Judgment:Fair  Insight:Poor   Executive Functions  Concentration:Fair  Attention Span:Good  Recall:Fair  Fund of Knowledge:Fair  Language:Good   Psychomotor Activity  Psychomotor Activity:Psychomotor Activity: Normal   Assets  Assets:Communication Skills; Housing; Leisure Time   Sleep  Sleep:Sleep: Good    Physical Exam: Physical Exam ROS Blood pressure (!) 126/93, pulse 98, temperature 98.5 F (36.9 C), temperature source Oral, resp. rate 18, weight 113.4 kg, SpO2 100 %. Body mass index is 33.91  kg/m.   Medical Decision Making: Continue seeking inpatient hospitalization.  Patient remains manic and some times inappropriate.  We will seek inpatient Geropsychiatry bed.  Problem 1: Bipolar 1 disorder   Earney Navy, NP-PMHNPBC 04/09/2022, 6:52 PM

## 2022-04-09 NOTE — ED Notes (Signed)
Placed meds in applesauce

## 2022-04-10 NOTE — Progress Notes (Signed)
Patient has been denied by [facility] per the provider. Patient meets BH inpatient criteria per Dahlia Byes, NP. Patient has been faxed out to the following facilities:   [facility]  [address] [place] [address] [phone]  [facility]  [address] [place] [address] [phone]  [facility]  [address] [place], [place] [address] [phone]  [facility]  [address] [place] [address] [phone]  [facility]  [address], [place] [address] [phone]  [facility]  [address], [place] [address] [phone]  [facility]  [address] [place] [address] [phone]  [facility] [address]  [address] [place] [address] [phone]  [facility]  [address], [place] [address] [phone]  [facility]  [address] [place] [address] [phone]  [facility]  [address], [place] [address] [phone]  [facility]  [address], [place] [address] [phone]  [facility]  [address], [place] [address] [phone]  [facility]  [address] [place] [address] [phone]  [facility]  [address] [place] [address] [phone]  [facility]  [address], [place] [address] [phone]  [facility]  [address] [place] [address] [phone]  [facility]  [address] [place]  [place] [address] [phone]  [facility]  [address] [place] [address] [phone]   Damita Dunnings, MSW, LCSW-A  10:57 AM 04/10/2022

## 2022-04-10 NOTE — ED Notes (Signed)
Patient is sleeping at this time. We will do VSs when he wakes up.

## 2022-04-10 NOTE — ED Notes (Signed)
Patient was calm and cooperative during the shift. He took his medication crushed in chocolate ice cream.

## 2022-04-10 NOTE — ED Notes (Signed)
Patient alert this shift. Patient has been hyper verbal during the whole shift. Labile. Confused. Rambling. Flight of ideas, disorganize.   Redirectable.  Medication compliant.

## 2022-04-11 DIAGNOSIS — F309 Manic episode, unspecified: Secondary | ICD-10-CM | POA: Diagnosis not present

## 2022-04-11 LAB — VALPROIC ACID LEVEL: Valproic Acid Lvl: 79 ug/mL (ref 50.0–100.0)

## 2022-04-11 NOTE — Consult Note (Signed)
[facility] ED ASSESSMENT   Reason for Consult:  Psychiatry evaluation Referring Physician:  ER Physician Patient Identification: Jorge Barrett MRN:  [id number] ED Chief Complaint: <principal problem not specified>  Diagnosis:  Active Problems:   Bipolar 1 disorder [facility])   ED Assessment Time Calculation: No data recorded   Subjective:   Jorge Barrett is a 76 y.o. male patient admitted with with no known hx of Mental illness until he lost 30 LBS from taking Ozempic two months ago.  He was recently hospitalized at [place] [facility] and discharged home after few days.  This time patient was seen on the flor of a gas station talking non stop about various things and was brought to [facility].  HPI: Patient was seen and assessed by this psychiatric nurse practitioner.  Patient was seen standing in his doorway, conversing with mental health technician.  Patient does appear to have appropriate thought processes, as he politely asked to " give me a second to finish my conversation with him.  I do not mean any disrespect to you.  But give me 1 minute. "  Patient continues to be very loud, obnoxious, and hyperverbal although he does appear to have some improvement from previous psychiatric reassessments.  He does continue to present with a flight of ideas, although he is able to have linear conversation and able to be redirected at this time.  Patient was recently discharged from [facility], after a 9-day length of stay.  Patient was discharged on May 26, presented to the emergency room on May 27.  Patient's medications have been resumed, Depakote level is now therapeutic at 65.  Considering patient's improvement over 5-day course of stay, will likely require 1 more day of stabilization and medication consistence prior to psychiatrically clearing.  Due to recent hospitalization at geriatric facility, will not benefit from another repeat hospitalization.  Patient may require  out-of-home placement for ongoing management, safety, psychiatric stabilization such as a group home.  Past Psychiatric History: Bipolar disorder, One short hospitalization at [place] unit.  Risk to Self or Others: Is the patient at risk to self? Yes Has the patient been a risk to self in the past 6 months? Yes Has the patient been a risk to self within the distant past? Yes Is the patient a risk to others? No Has the patient been a risk to others in the past 6 months? No Has the patient been a risk to others within the distant past? No  [place] Scale:  [place] ED from 04/06/2022 in [place] DEPT Admission (Discharged) from 03/26/2022 in [place] ED from 03/25/2022 in [place] CATEGORY No Risk No Risk No Risk       AIMS:  , , ,  ,   ASAM:    Substance Abuse:     Past Medical History:  Past Medical History:  Diagnosis Date   Diabetes mellitus without complication ([place])    High cholesterol    Hypertension    Hypothyroidism    No past surgical history on file. Family History: No family history on file. Family Psychiatric  History: unknown Social History:  Social History   Substance and Sexual Activity  Alcohol Use Yes   Alcohol/week: 2.0 standard drinks   Types: 2 Shots of liquor per week   Comment: 1-2 shots at least twice a week     Social History   Substance and Sexual  Activity  Drug Use Not Currently    Social History   Socioeconomic History   Marital status: Married    Spouse name: Not on file   Number of children: 2   Years of education: 9   Highest education level: 9th grade  Occupational History   Not on file  Tobacco Use   Smoking status: Former    Types: Cigarettes   Smokeless tobacco: Former    Types: Chew   Tobacco comments:    Stopped smoking and chewing tobacco years ago  Scientific laboratory technician Use: Never used   Substance and Sexual Activity   Alcohol use: Yes    Alcohol/week: 2.0 standard drinks    Types: 2 Shots of liquor per week    Comment: 1-2 shots at least twice a week   Drug use: Not Currently   Sexual activity: Not on file  Other Topics Concern   Not on file  Social History Narrative   Married, lives with wife, has 2 adult children, owns business "Patent examiner".   Social Determinants of Health   Financial Resource Strain: Not on file  Food Insecurity: Not on file  Transportation Needs: Not on file  Physical Activity: Not on file  Stress: Not on file  Social Connections: Not on file   Additional Social History:    Allergies:   Allergies  Allergen Reactions   Sulfur     Other reaction(s): Unknown Other reaction(s): Unknown    Trimethoprim Rash    Labs:  Results for orders placed or performed during the hospital encounter of 04/06/22 (from the past 48 hour(s))  Valproic acid level     Status: None   Collection Time: 04/11/22  6:32 AM  Result Value Ref Range   Valproic Acid Lvl 79 50.0 - 100.0 ug/mL    Comment: Performed at [facility], [place] [address] [place], [place] [address]    Current Facility-Administered Medications  Medication Dose Route Frequency Provider Last Rate Last Admin   allopurinol (ZYLOPRIM) tablet 100 mg  100 mg Oral Daily Maudie Flakes, MD   100 mg at 04/10/22 0906   amLODipine (NORVASC) tablet 10 mg  10 mg Oral Daily Maudie Flakes, MD   10 mg at 04/11/22 0932   clonazepam (KLONOPIN) disintegrating tablet 0.25 mg  0.25 mg Oral TID Charmaine Downs C, NP   0.25 mg at 04/10/22 2134   divalproex (DEPAKOTE SPRINKLE) capsule 500 mg  500 mg Oral TID Charmaine Downs C, NP   500 mg at 04/11/22 0932   levothyroxine (SYNTHROID) tablet 75 mcg  75 mcg Oral Q0600 Maudie Flakes, MD   75 mcg at 04/11/22 [id number]   lisinopril (ZESTRIL) tablet 40 mg  40 mg Oral Daily Maudie Flakes, MD   40 mg at 04/11/22 0932   LORazepam (ATIVAN)  tablet 1 mg  1 mg Oral Q1H PRN Daleen Bo, MD       melatonin tablet 3 mg  3 mg Oral QHS Daleen Bo, MD   3 mg at 04/10/22 2134   metFORMIN (GLUCOPHAGE-XR) 24 hr tablet 500 mg  500 mg Oral BID WC Maudie Flakes, MD   500 mg at 04/11/22 0932   pantoprazole (PROTONIX) EC tablet 40 mg  40 mg Oral Daily Maudie Flakes, MD   40 mg at 04/11/22 0932   QUEtiapine (SEROQUEL) tablet 50 mg  50 mg Oral BID Charmaine Downs C, NP   50 mg at 04/11/22 [phone]  rosuvastatin (CRESTOR) tablet 10 mg  10 mg Oral QHS Maudie Flakes, MD   10 mg at 04/10/22 2142   temazepam (RESTORIL) capsule 7.5 mg  7.5 mg Oral QHS PRN Charmaine Downs C, NP   7.5 mg at 04/10/22 2134   Current Outpatient Medications  Medication Sig Dispense Refill   allopurinol (ZYLOPRIM) 100 MG tablet Take 100 mg by mouth daily.     amLODipine (NORVASC) 10 MG tablet Take 10 mg by mouth daily.     levothyroxine (SYNTHROID) 75 MCG tablet Take 75 mcg by mouth daily.     lisinopril (ZESTRIL) 40 MG tablet Take 40 mg by mouth daily.     omeprazole (PRILOSEC) 20 MG capsule Take 20 mg by mouth daily.     rosuvastatin (CRESTOR) 10 MG tablet Take 10 mg by mouth daily.     Semaglutide, 2 MG/DOSE, 8 MG/3ML SOPN Inject 2 mg into the skin once a week. Sunday     tamsulosin (FLOMAX) 0.4 MG CAPS capsule Take 0.4 mg by mouth daily.     divalproex (DEPAKOTE ER) 500 MG 24 hr tablet Take 3 tablets (1,500 mg total) by mouth at bedtime. (Patient not taking: Reported on 04/06/2022) 90 tablet 3   metFORMIN (GLUCOPHAGE-XR) 500 MG 24 hr tablet Take 1 tablet (500 mg total) by mouth 2 (two) times daily with a meal. (Patient not taking: Reported on 04/06/2022) 60 tablet 3   QUEtiapine (SEROQUEL) 100 MG tablet Take 1 tablet (100 mg total) by mouth at bedtime. (Patient not taking: Reported on 04/06/2022) 30 tablet 3   tadalafil (CIALIS) 10 MG tablet Take 10 mg by mouth as needed for erectile dysfunction.     temazepam (RESTORIL) 15 MG capsule Take 1 capsule (15 mg total)  by mouth at bedtime as needed for sleep. (Patient not taking: Reported on 04/06/2022) 30 capsule 0    Musculoskeletal: Strength & Muscle Tone: within normal limits Gait & Station: normal Patient leans: Front   Psychiatric Specialty Exam: Presentation  General Appearance: Fairly Groomed; Neat  Eye Contact:Good  Speech:Clear and Coherent; Pressured  Speech Volume:Normal  Handedness:Right   Mood and Affect  Mood:Euphoric; Anxious  Affect:Congruent; Full Range   Thought Process  Thought Processes:Coherent; Goal Directed  Descriptions of Associations:Tangential  Orientation:Full (Time, Place and Person)  Thought Content:Delusions; Tangential  History of Schizophrenia/Schizoaffective disorder:No  Duration of Psychotic Symptoms:Greater than six months  Hallucinations:No data recorded  Ideas of Reference:None  Suicidal Thoughts:No data recorded  Homicidal Thoughts:No data recorded   Sensorium  Memory:Immediate Fair; Recent Fair; Remote Fair  Judgment:Fair  Insight:Poor   Executive Functions  Concentration:Fair  Attention Span:Good  [place]  Language:Good   Psychomotor Activity  Psychomotor Activity:No data recorded  Assets  Assets:Communication Skills; Housing; Leisure Time    Sleep  Sleep:No data recorded  Physical Exam:  Unable to engage in any meaningful conversation due to Manic symptoms. Physical Exam ROS Blood pressure 111/67, pulse 94, temperature 98.2 F (36.8 C), temperature source Oral, resp. rate 17, weight 113.4 kg, SpO2 100 %. Body mass index is 33.91 kg/m.  Medical Decision Making: Jorge Barrett is a 76 year old male currently under IVC, after being found in the floor of a gas station rambling, tangential, flight of ideas, and mania.  He was recently discharged 1 [facility] prior from [facility], in which he was stable at that time for discharge after a 9-day length of stay.  During his  hospitalization patient was lout, obnoxious, and intrusive at  times however did improve.  Patient is now showing signs of improvement, as we are leaning more towards psychiatric stabilization in the emergency department.  Patient has been compliant with his psychotropic medications, Depakote level is now therapeutic at 10.  It is felt that he has maximized hospitalization and achieved optimal stabilization during this emergency room stay of 6 days.  Will psychiatrically clear tomorrow.   Problem 1: Bipolar Disorder, Manic Disposition: TTS will continue to follow, patient gearing towards achieving optimal stabilization in the emergency room.  Will likely psychiatrically cleared tomorrow.  Patient will benefit from out-of-home placement and or long-term care for ongoing safety and management.  Suella Broad, FNP-PMHNP-BC 04/11/2022 2:15 PM

## 2022-04-11 NOTE — Progress Notes (Signed)
Pt has been accepted to [facility] 04/12/2022; [facility], Accepting provider: Lucilla Edin, MD, Phone number dor report: [phone].  Pt meets [place] inpatient criteria per Carolee Rota, NP.  Nursing notified: Rushie Nyhan, NT.   Benjaman Kindler, MSW, [facility] 04/11/2022 7:58 PM

## 2022-04-11 NOTE — Progress Notes (Signed)
Patient has been denied by [facility] due to previous admission where he was sexually inappropriate at their facility. CSW will continue to seek recommended disposition.    Mariea Clonts, MSW, LCSW-A  10:06 AM 04/11/2022

## 2022-04-11 NOTE — ED Notes (Signed)
Patient sleeping at this time. Will do VSs when he wakes up.  

## 2022-04-11 NOTE — Progress Notes (Signed)
Patient has been denied by [facility] and has been faxed out. Patient meets [place] inpatient criteria per Carolee Rota, NP. Patient has been faxed out to the following facilities:   [facility]  [address], [place] [address] [phone]  [facility]  [address] [place] [address] [phone] [facility]  [place], [place] [address] [phone]  [facility]  [address], [place] [address] [phone] [place]  [address] [place] [address] [phone] [facility]  [address], [place] [address] [phone] [facility]  [place], [place] [address] [phone] [place], [place] [phone]  [facility]  [address], [place] [address] [id number] [place] [place] [address] [phone] [place]  [address], [place] [address] [phone] [facility]  [address], [place] [address] [phone]  [facility]  [address], [place] [address] [phone]  [place] [address], [place] [address] [phone]  [facility]  [place], [place] [address] [phone]  [place], [place] [address] [phone] [place], [place] [address] [phone]  [facility]  [address],  [place]  [address] [phone]  [facility]  [address] [place]  [address] [phone] [place], MSW, LCSW-A  9:55 AM 04/11/2022

## 2022-04-11 NOTE — ED Notes (Signed)
Patient slept all night after giving him his night-time medications. He had incontinent episode and wet the bed. We assisted to help him get cleaned up and scrubs changed. Bed linen changed. Depakote level drawn and sent to lab. Now awake, he is speaking as much as possible regarding his wife cheating on him after she turned 76 years old.

## 2022-04-11 NOTE — Progress Notes (Signed)
Patient has been denied by [facility] due to acuity. Patient meets BH inpatient criteria per Fayette Pho, NP. Patient has been faxed out to the following facilities:   [facility]  [address] [place] [address] [phone]  [facility]  [address] [place] [address] [phone]  [facility]  [address] [place], [place] [address] [phone]  [facility]  [address] [place] [address] [phone]  [facility]  [address] [place] [address] [phone]  [facility]  [address] [place] [address] [phone]  [facility]  [address] [place] [address] [phone]  [facility] [address]  [address] [place] [address] [phone]  [facility]  [address], [place] [address] [phone]  [facility]  [address] [place] [address] [phone]  [facility]  [address], [place] [address] [phone]  [facility]  [address], [place] [address] [phone]  [facility]  [address] [place] [address] [phone]  [facility]  [address] [place] [address] [phone]  [facility]  [address] [place] [address] [phone]  [facility]  [address], [place] [address] [phone]  [facility]  [address] [place] [address] [phone]  [facility]  [address] [place]  [address] [phone]  [facility]  [address], [place] [address] [phone]   Damita Dunnings, MSW, LCSW-A  3:20 PM 04/11/2022

## 2022-04-11 NOTE — ED Provider Notes (Signed)
Emergency Medicine Observation Re-evaluation Note  Jorge Barrett is a 76 y.o. male, seen on rounds today.  Pt initially presented to the ED for complaints of Psychiatric Evaluation (Pt arrived via EMS from [employer] in the floor yelling out about multiple complaints. ) Currently, the patient is resting quietly.  Physical Exam  BP 111/67 (BP Location: Left Arm)   Pulse 94   Temp 98.2 F (36.8 C) (Oral)   Resp 17   Wt 113.4 kg   SpO2 100%   BMI 33.91 kg/m  Physical Exam General: No acute distress Cardiac: Well-perfused Lungs: Nonlabored Psych: Redirectable  ED Course / MDM  EKG:EKG Interpretation  Date/Time:  Saturday Apr 06 2022 16:29:27 EDT Ventricular Rate:  90 PR Interval:  150 QRS Duration: 92 QT Interval:  352 QTC Calculation: 430 R Axis:   -55 Text Interpretation: Normal sinus rhythm Left anterior fascicular block Abnormal ECG No previous ECGs available Confirmed by Jorge Barrett [phone]) on 04/06/2022 4:40:38 PM  I have reviewed the labs performed to date as well as medications administered while in observation.  Recent changes in the last 24 hours include psychiatry reassessment.  Plan  Current plan is for inpatient psychiatric admission. [name] is under involuntary commitment.      Jorge Files, MD 04/11/22 1730

## 2022-04-12 NOTE — ED Notes (Signed)
Attempted to call report to [facility]. They said they do not take report until "transport is there." Calling for IVC transfer now.

## 2022-04-12 NOTE — ED Notes (Signed)
Obtained pt belonging bags from locker 27. Pt belonging include brown boots, black socks, jeans w suspenders, $2.60 in change, $60 cash, sunglasses, plaid long-sleeve shirt, black t-shirt, pocket knife, Seiko watch, Trump silver dollar, black wallet with insurance card, and a 4-inch thick stack of plastic credit cards with a rubber band around them.

## 2023-01-13 IMAGING — CR DG HIP (WITH OR WITHOUT PELVIS) 2-3V*R*
2 series · 3 of 3 positions shown · non-contrast
Comparison: None Available.

CLINICAL DATA: Hip and right groin pain.

EXAM:
DG HIP (WITH OR WITHOUT PELVIS) 2-3V RIGHT

[Series 1: hip ap · 0.14mm/px · 2 of 2 slices shown]
[im 1/2]
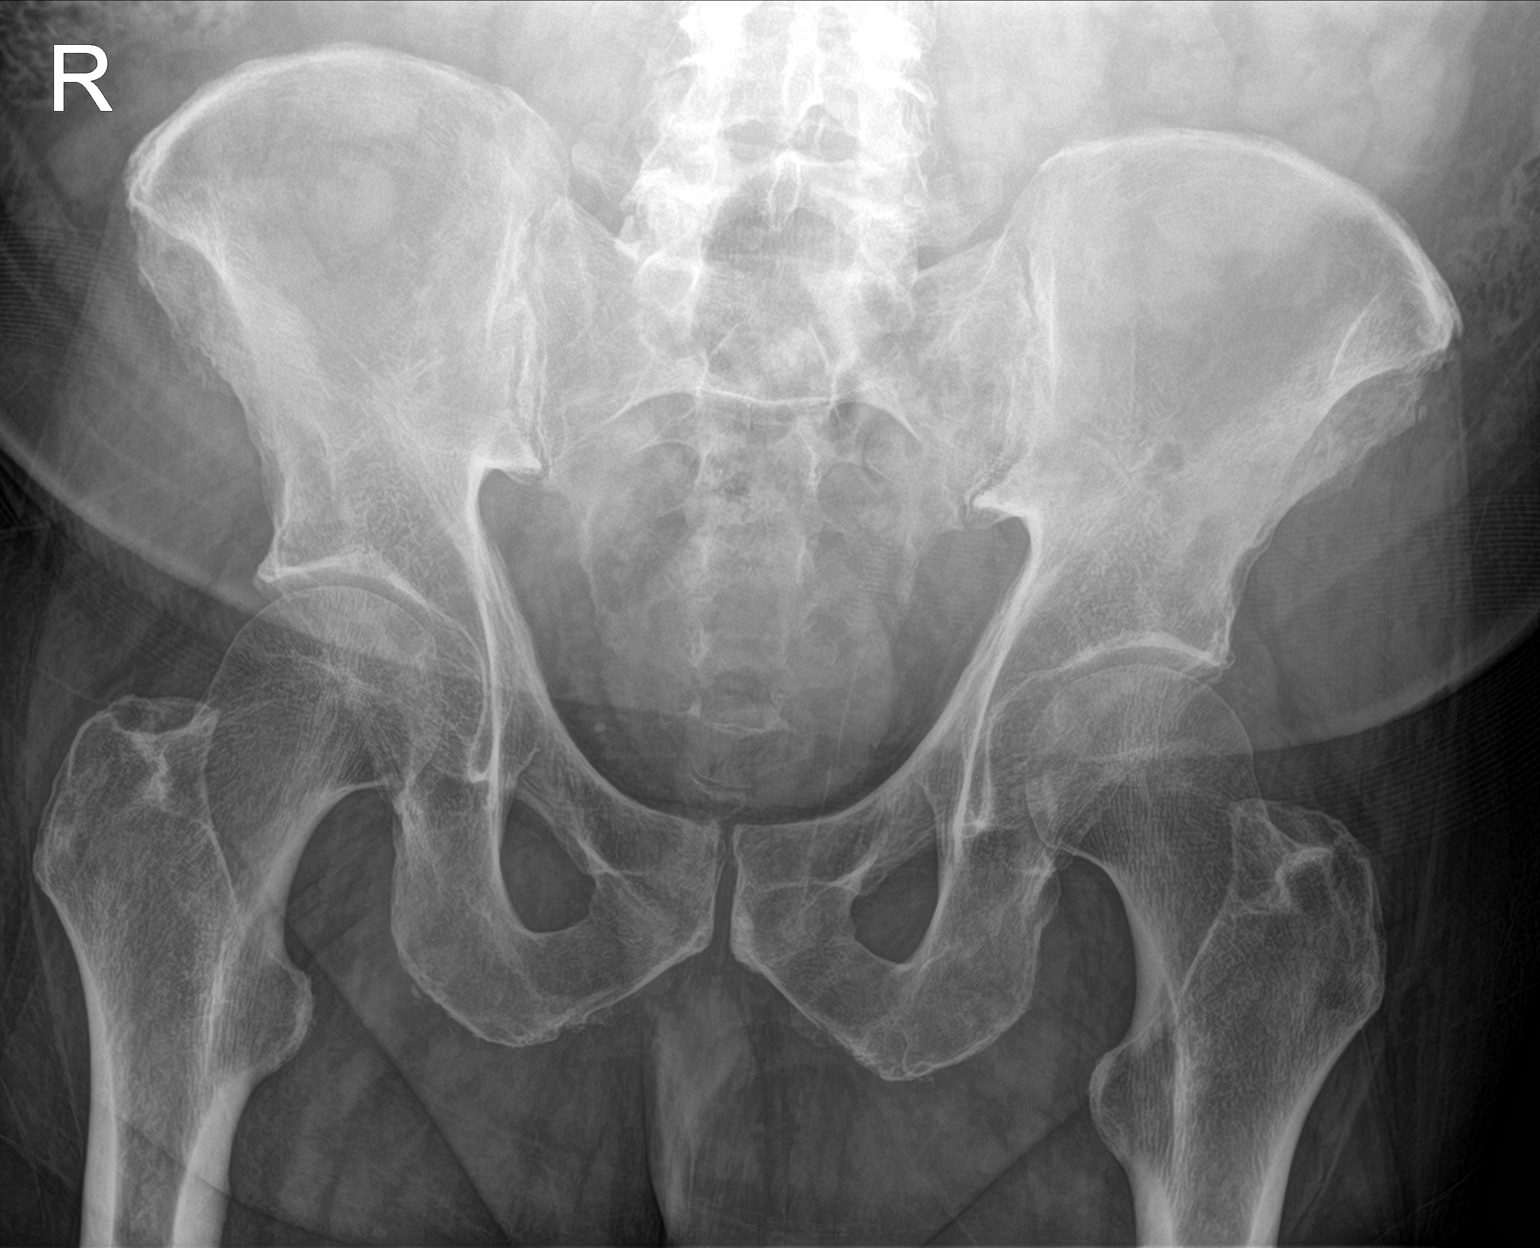
[im 2/2]
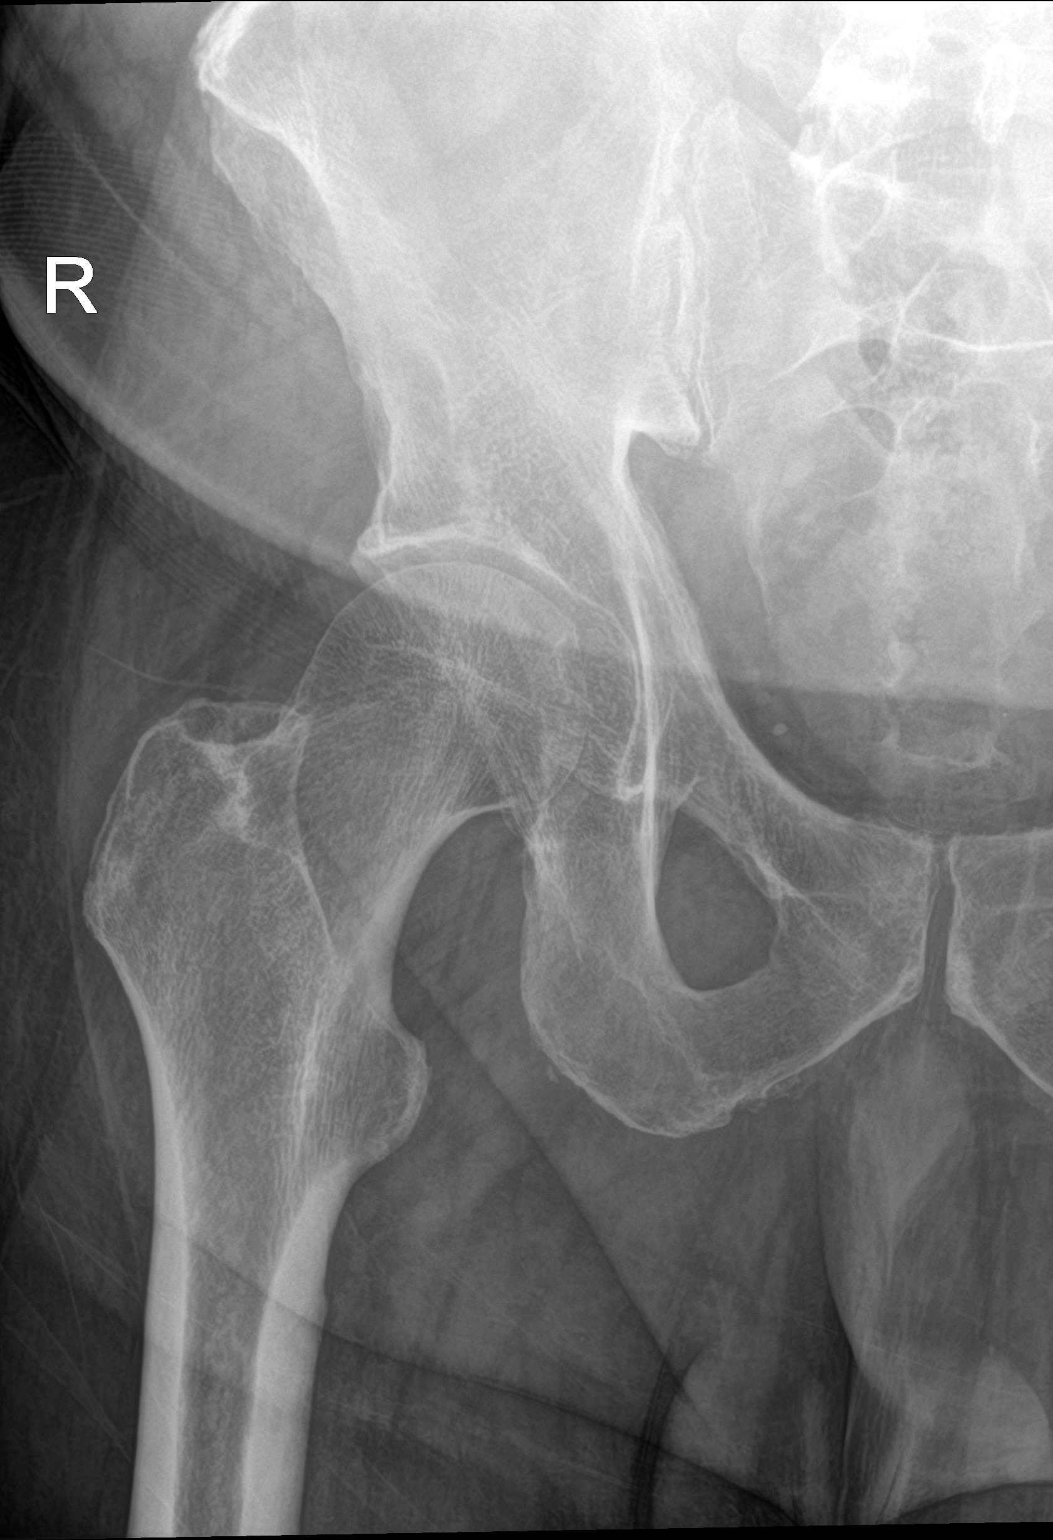

[hip lat]
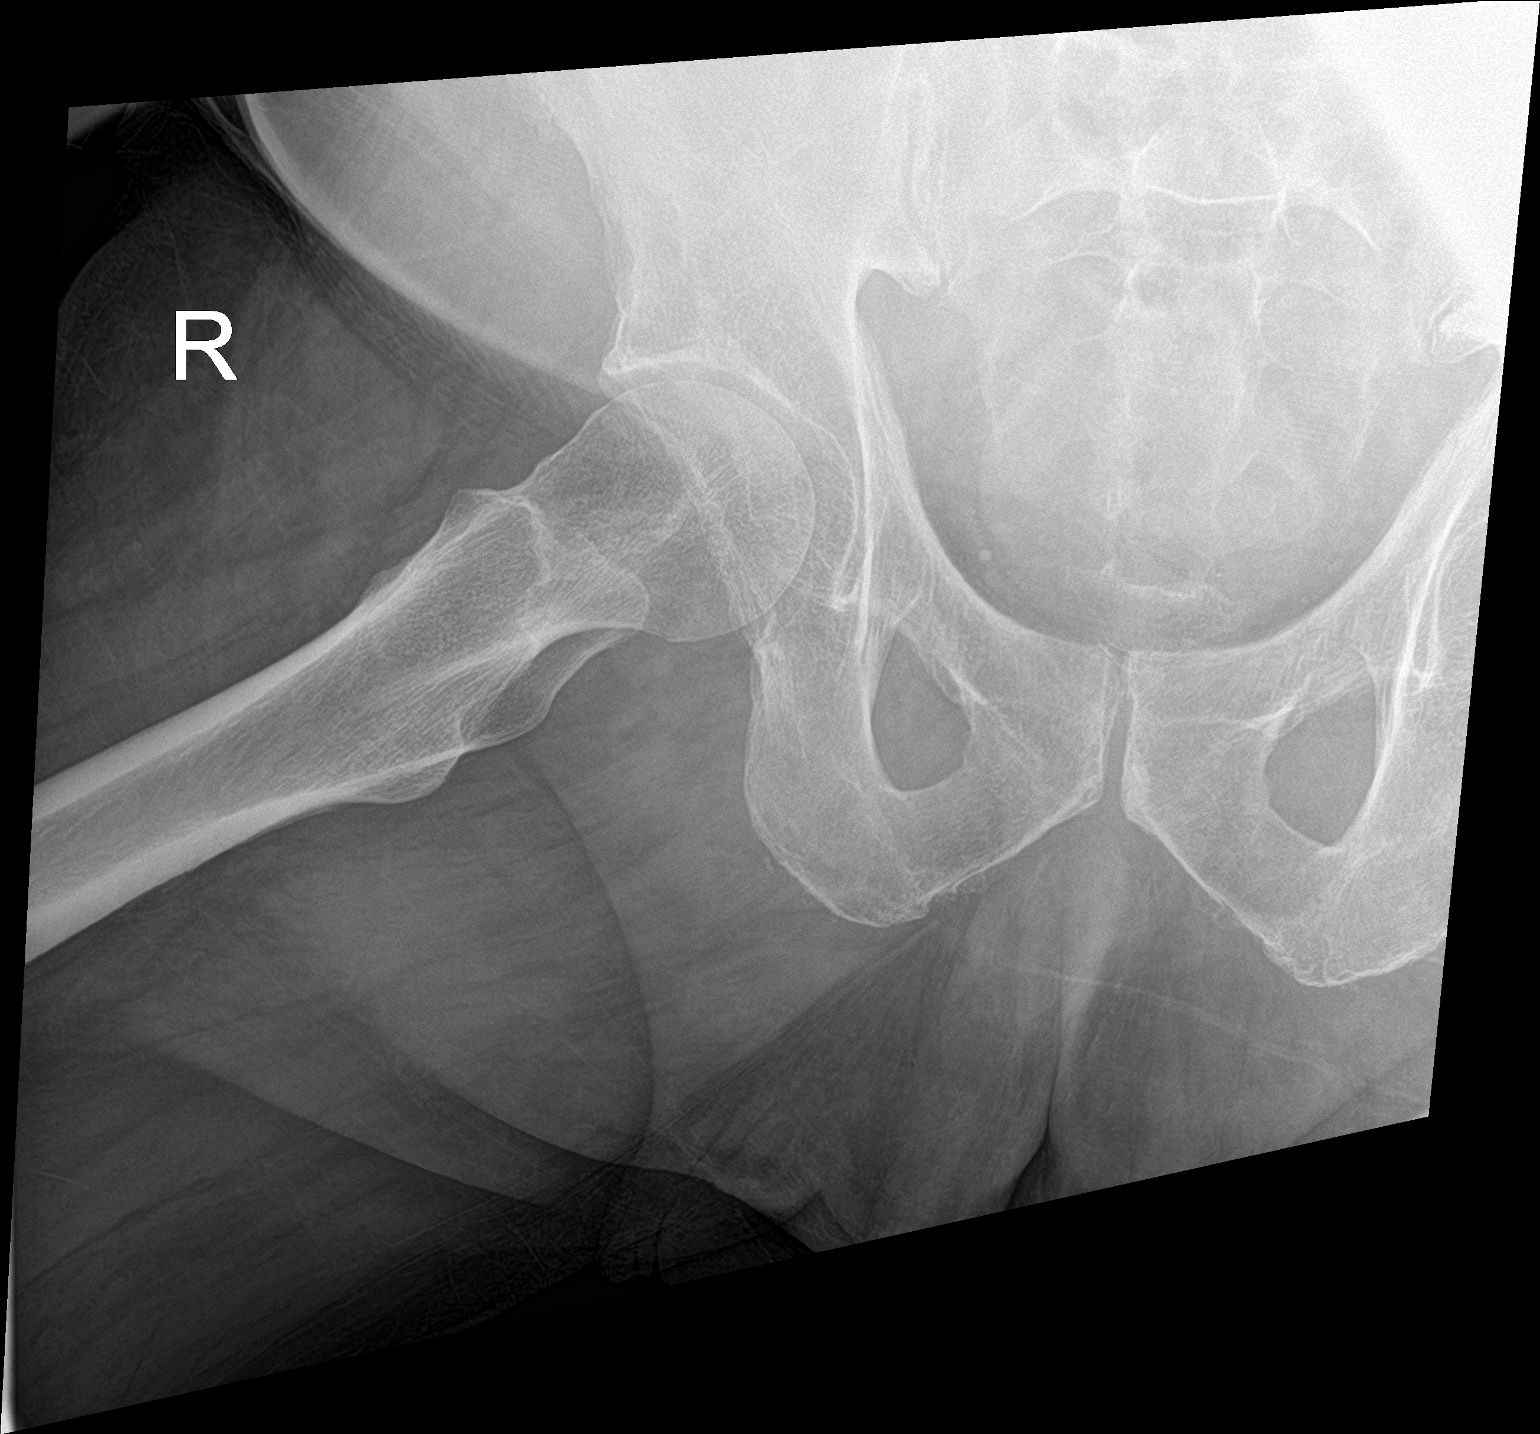

[3 of 3 positions shown; findings below may reference images not displayed]

FINDINGS: No evidence of acute fracture or joint malalignment. Mild bilateral
hip degenerative change. Partially imaged lower lumbar degenerative
change.
IMPRESSION: 1. No evidence of acute fracture or joint malalignment.
2. Mild bilateral hip degenerative change.
